# Patient Record
Sex: Female | Born: 1985
Health system: Southern US, Community
[De-identification: ages and names within clinical notes are randomized; demographics above are authoritative.]

## PROBLEM LIST (undated history)

## (undated) DIAGNOSIS — Z720 Tobacco use: Secondary | ICD-10-CM

## (undated) DIAGNOSIS — L409 Psoriasis, unspecified: Secondary | ICD-10-CM

## (undated) DIAGNOSIS — O020 Blighted ovum and nonhydatidiform mole: Secondary | ICD-10-CM

## (undated) DIAGNOSIS — K219 Gastro-esophageal reflux disease without esophagitis: Secondary | ICD-10-CM

## (undated) HISTORY — DX: Gastro-esophageal reflux disease without esophagitis: K21.9

## (undated) HISTORY — DX: Tobacco use: Z72.0

## (undated) HISTORY — DX: Psoriasis, unspecified: L40.9

## (undated) HISTORY — PX: DILATION AND CURETTAGE OF UTERUS: SHX78

## (undated) HISTORY — DX: Blighted ovum and nonhydatidiform mole: O02.0

---
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
## 2005-04-03 ENCOUNTER — Emergency Department (HOSPITAL_COMMUNITY): Admission: EM | Admit: 2005-04-03 | Discharge: 2005-04-03 | Payer: Self-pay | Admitting: Emergency Medicine

## 2005-09-21 ENCOUNTER — Inpatient Hospital Stay: Payer: Self-pay | Admitting: Obstetrics & Gynecology

## 2005-09-26 ENCOUNTER — Emergency Department: Payer: Self-pay | Admitting: Emergency Medicine

## 2006-12-24 ENCOUNTER — Emergency Department: Payer: Self-pay | Admitting: Emergency Medicine

## 2008-01-24 ENCOUNTER — Emergency Department: Payer: Self-pay | Admitting: Emergency Medicine

## 2008-11-30 ENCOUNTER — Emergency Department: Payer: Self-pay | Admitting: Internal Medicine

## 2009-03-15 ENCOUNTER — Emergency Department: Payer: Self-pay | Admitting: Emergency Medicine

## 2010-03-13 ENCOUNTER — Emergency Department: Payer: Self-pay | Admitting: Internal Medicine

## 2011-01-18 ENCOUNTER — Ambulatory Visit: Payer: Self-pay | Admitting: Family Medicine

## 2011-01-30 HISTORY — PX: TUBAL LIGATION: SHX77

## 2011-03-19 ENCOUNTER — Emergency Department: Payer: Self-pay | Admitting: Emergency Medicine

## 2011-03-19 LAB — URINALYSIS, COMPLETE
Bilirubin,UR: NEGATIVE
Glucose,UR: NEGATIVE mg/dL (ref 0–75)
Ketone: NEGATIVE
Leukocyte Esterase: NEGATIVE
Specific Gravity: 1.01 (ref 1.003–1.030)
Squamous Epithelial: 1

## 2011-03-19 LAB — CBC
HCT: 32.2 % — ABNORMAL LOW (ref 35.0–47.0)
HGB: 11.2 g/dL — ABNORMAL LOW (ref 12.0–16.0)
MCV: 87 fL (ref 80–100)
RBC: 3.72 10*6/uL — ABNORMAL LOW (ref 3.80–5.20)
RDW: 13.5 % (ref 11.5–14.5)
WBC: 9.1 10*3/uL (ref 3.6–11.0)

## 2011-03-19 LAB — HCG, QUANTITATIVE, PREGNANCY: Beta Hcg, Quant.: 53644 m[IU]/mL — ABNORMAL HIGH

## 2011-08-20 ENCOUNTER — Observation Stay: Payer: Self-pay

## 2011-08-24 ENCOUNTER — Observation Stay: Payer: Self-pay | Admitting: Obstetrics & Gynecology

## 2011-08-24 LAB — URINALYSIS, COMPLETE
Bacteria: NONE SEEN
Bilirubin,UR: NEGATIVE
Blood: NEGATIVE
Glucose,UR: NEGATIVE mg/dL (ref 0–75)
Protein: NEGATIVE
Specific Gravity: 1.008 (ref 1.003–1.030)
WBC UR: 1 /HPF (ref 0–5)

## 2011-09-07 ENCOUNTER — Inpatient Hospital Stay: Payer: Self-pay | Admitting: Obstetrics and Gynecology

## 2011-09-07 LAB — CBC WITH DIFFERENTIAL/PLATELET
Basophil %: 0.3 %
Eosinophil #: 0.1 10*3/uL (ref 0.0–0.7)
HCT: 31.8 % — ABNORMAL LOW (ref 35.0–47.0)
HGB: 10.9 g/dL — ABNORMAL LOW (ref 12.0–16.0)
Lymphocyte #: 1.9 10*3/uL (ref 1.0–3.6)
MCH: 29 pg (ref 26.0–34.0)
MCHC: 34.4 g/dL (ref 32.0–36.0)
MCV: 84 fL (ref 80–100)
Monocyte #: 1.2 x10 3/mm — ABNORMAL HIGH (ref 0.2–0.9)
Monocyte %: 11.3 %
Neutrophil #: 7.6 10*3/uL — ABNORMAL HIGH (ref 1.4–6.5)

## 2011-09-12 LAB — PATHOLOGY REPORT

## 2012-01-04 IMAGING — US US OB < 14 WEEKS - US OB TV
1 series · 17 of 28 positions shown · non-contrast
Comparison: none

REASON FOR EXAM: Hx of molar pregnancy
COMMENTS:

[Series 1: us ob < 14 weeks - us ob tv · 17 of 73 slices shown]
[im 1/73]
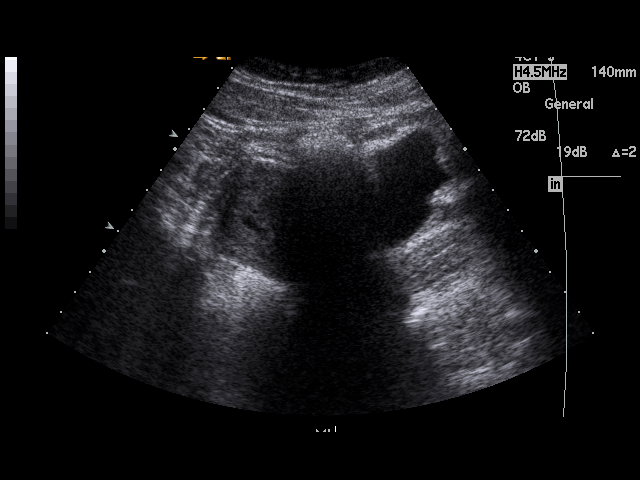
[im 6/73]
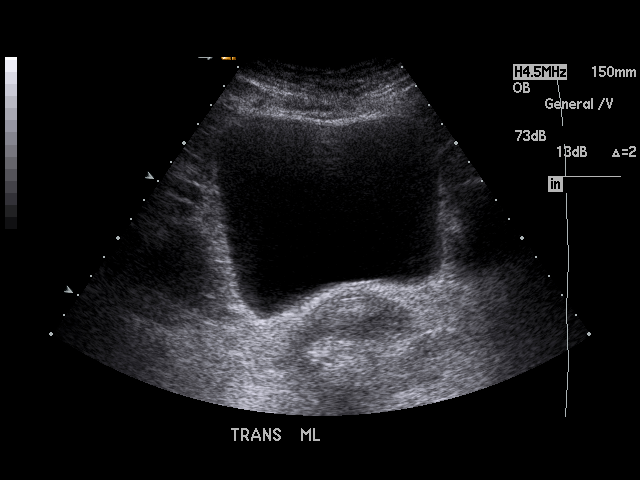
[im 11/73]
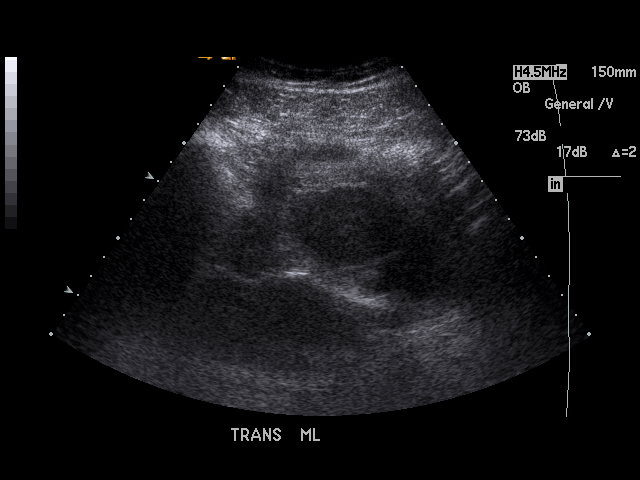
[im 14/73]
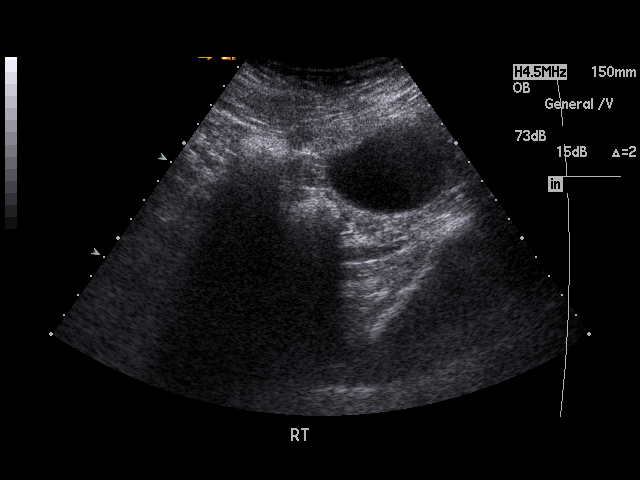
[im 19/73]
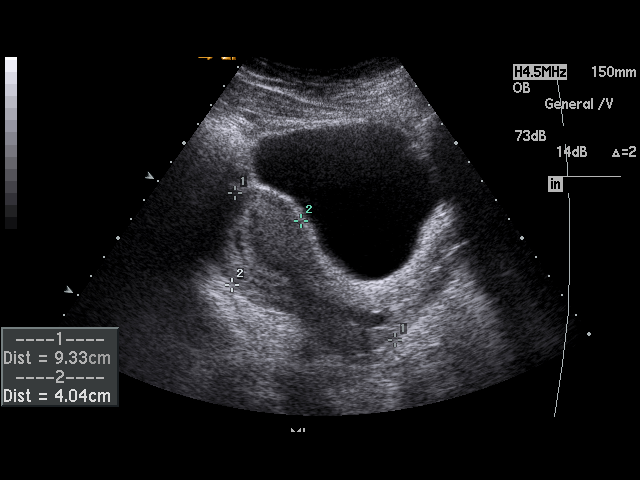
[im 25/73]
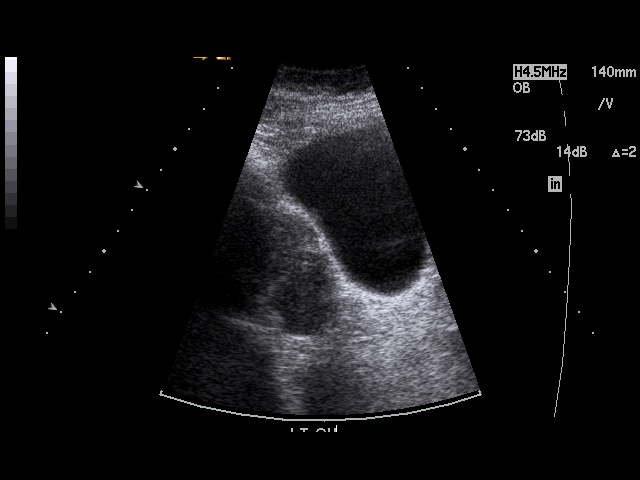
[im 27/73]
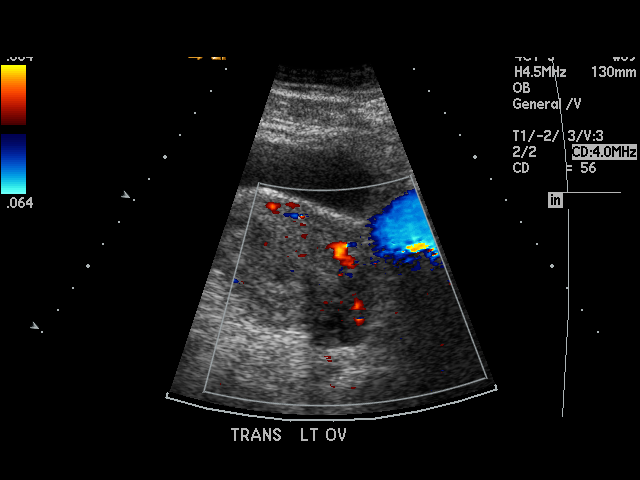
[im 33/73]
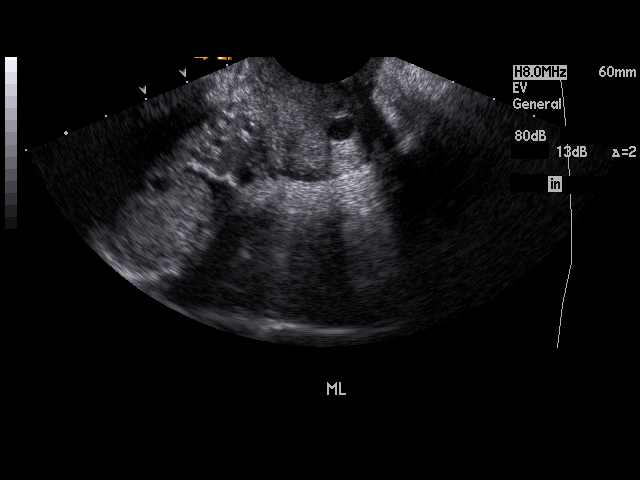
[im 38/73]
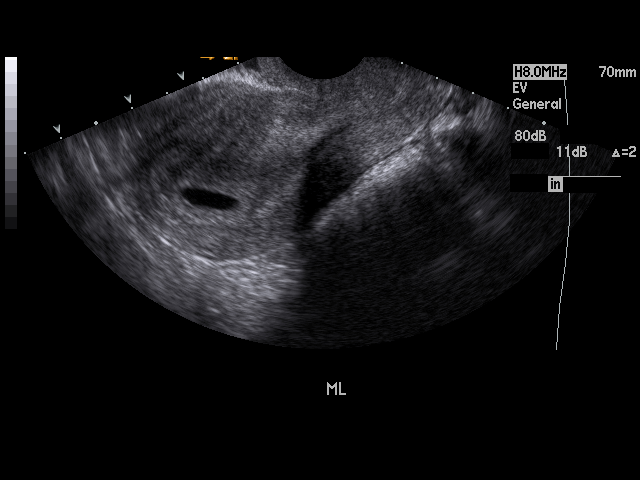
[im 41/73]
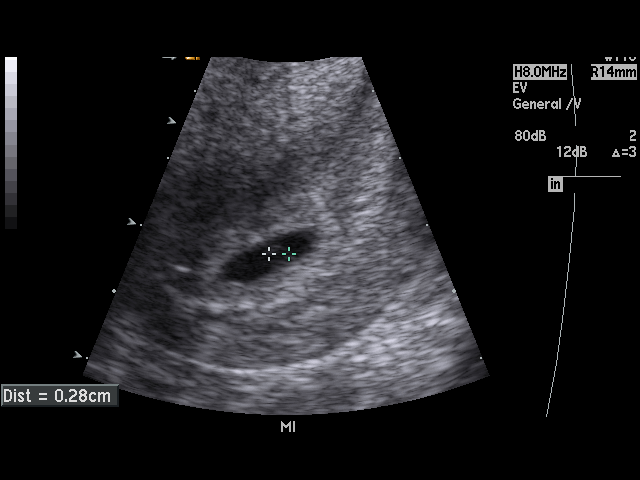
[im 46/73]
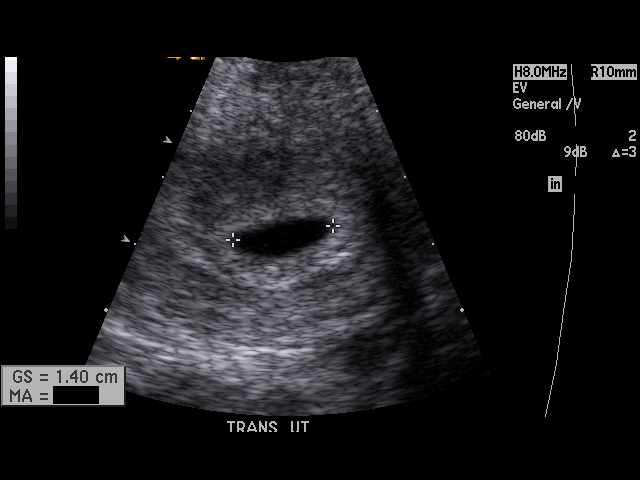
[im 49/73]
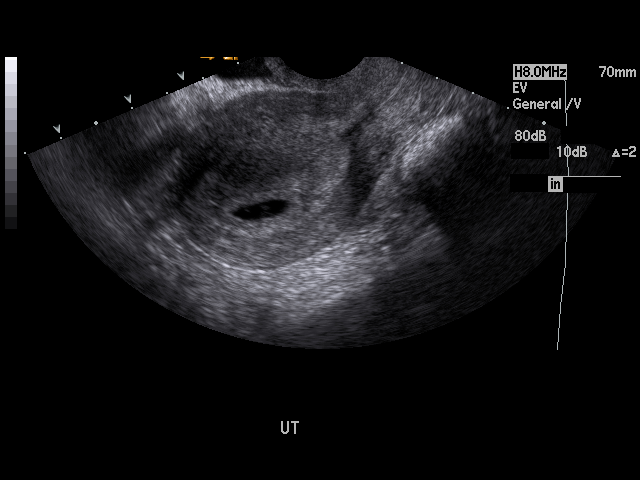
[im 54/73]
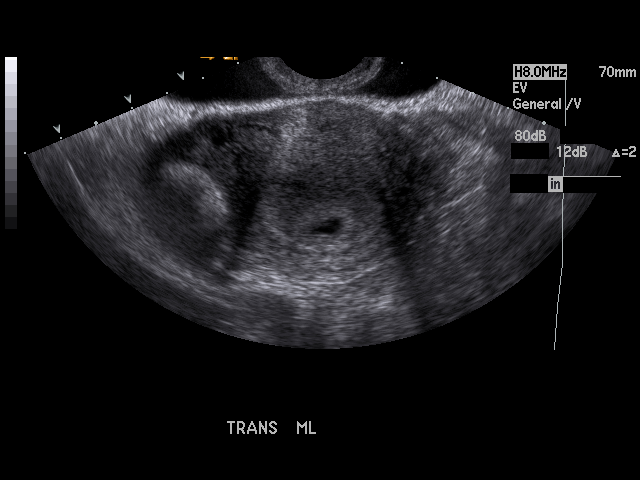
[im 59/73]
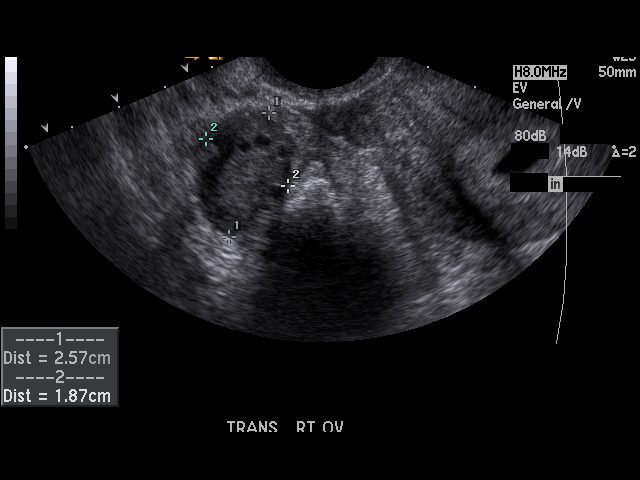
[im 62/73]
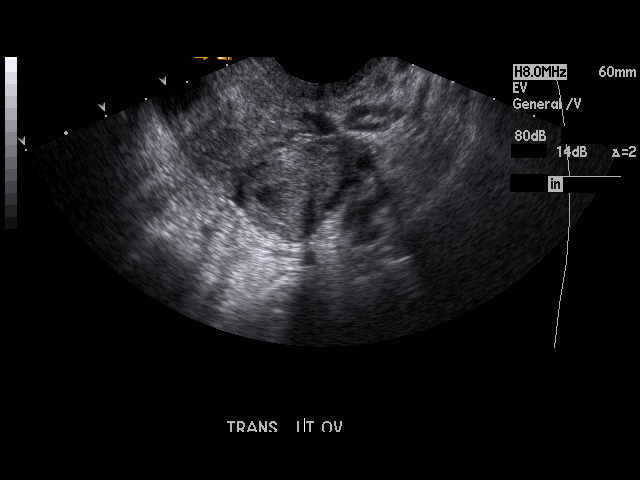
[im 67/73]
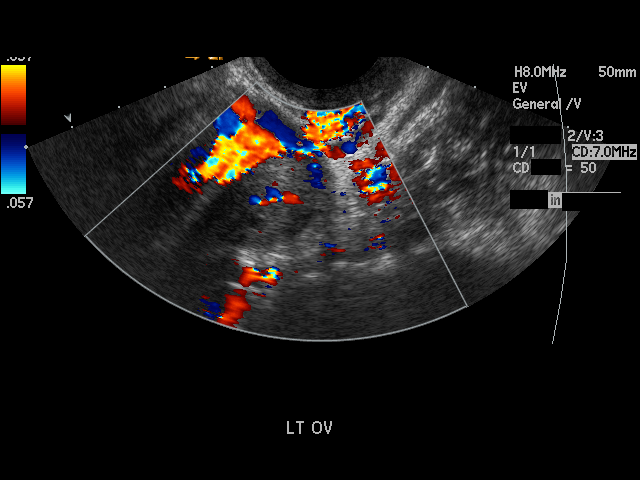
[im 73/73]
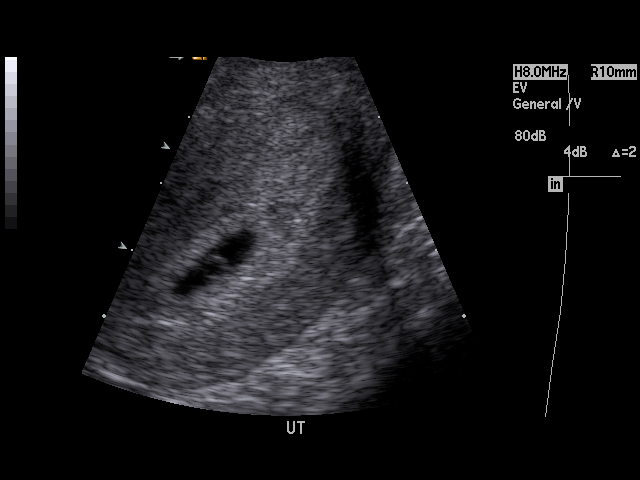

[17 of 28 positions shown; findings below may reference images not displayed]

PROCEDURE:     US  - US OB LESS THAN 14 WEEKS/W TRANS  - January 18, 2011 [DATE]

RESULT:     Transabdominal and endovaginal ultrasound was performed. There
is an intrauterine cystic structure that likely represents an intrauterine
gestational sac. Within the sac, there is a 2.8 mm concentric cystic area
compatible with a yolk sac. No intrauterine embryo is seen. Gestational sac
size corresponds to a menstrual age of approximately 5 weeks 2 days.

The absence of visualization of an intrauterine embryo in the pregnant
patient can be seen with very early intrauterine gestation where the embryo
is not yet visible, blighted ovum, recent abortion or ectopic pregnancy.
Since what appears to be a yolk sac is present, the most likely for this
patient is a very early intrauterine gestation where the embryo is not yet
visible. Confirmation by followup ultrasound is suggested. No abnormal
adnexal masses are seen. The right and left ovaries are visualized.
Incidental note is made of a 2.07 cm complex cyst or corpus luteal cyst of
the left ovary. Ovarian follicles are seen bilaterally. No free fluid is
observed in the pelvis.
IMPRESSION: 1. There is observed an intrauterine cystic area consistent with a
gestational sac of approximately 5 weeks 2 days.
2. What appears to be a yolk sac is visualized.
3. No intrauterine uterine embryo is seen at this time. Followup examination
is, therefore, recommended.

## 2012-03-04 IMAGING — US US OB US >=[ID] SNGL FETUS
1 series · 17 of 28 positions shown · non-contrast
Comparison: none

REASON FOR EXAM: 14  weeks pregnant - bleeding post coitial
COMMENTS:

PROCEDURE:     US  - US OB GREATER/OR EQUAL TO [id number]  - March 19, 2011  [DATE]
RESULT:

[Series 1: us ob us >=(id) sngl fetus · 17 of 52 slices shown]
[im 1/52]
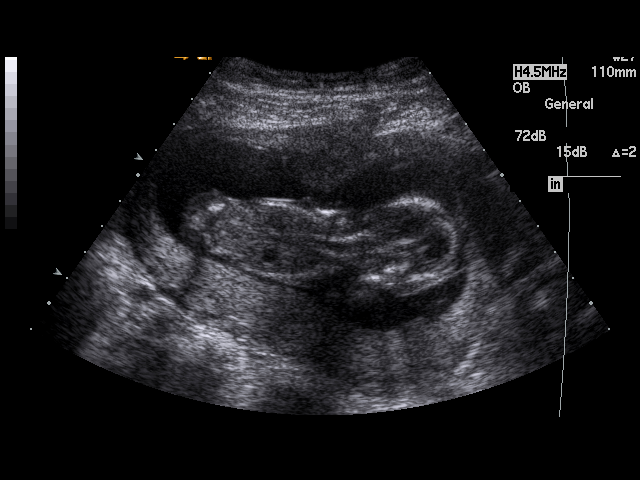
[im 4/52]
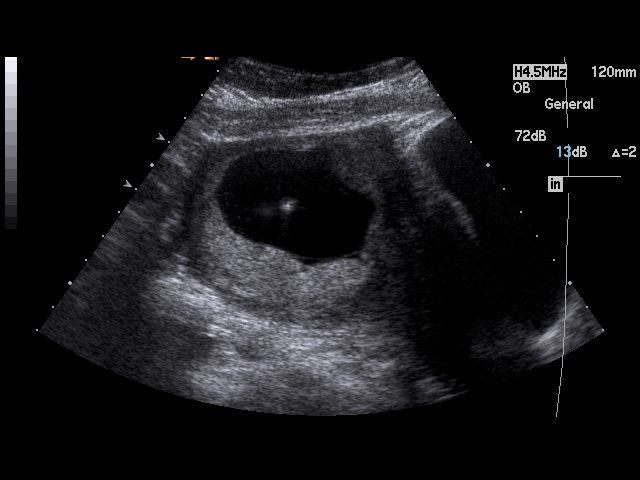
[im 8/52]
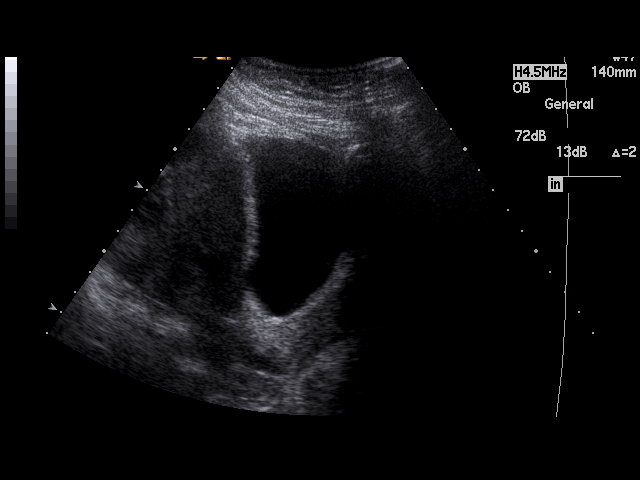
[im 10/52]
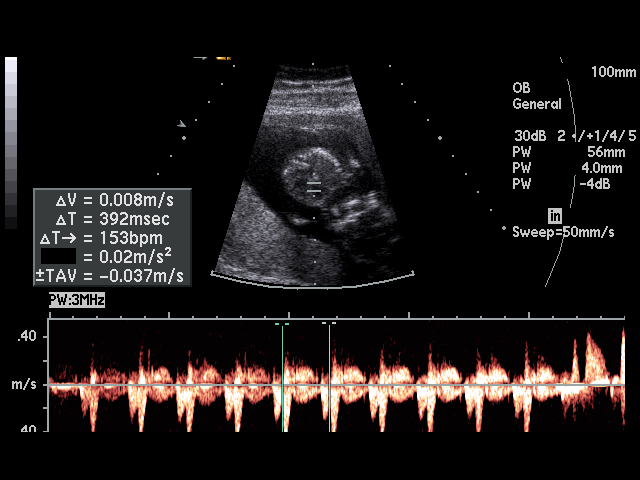
[im 14/52]
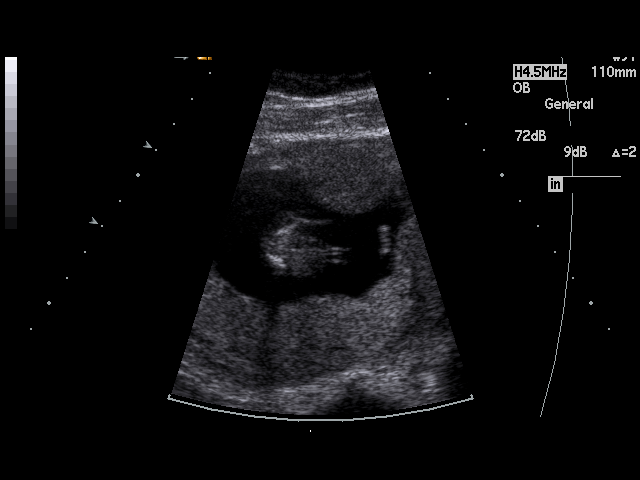
[im 18/52]
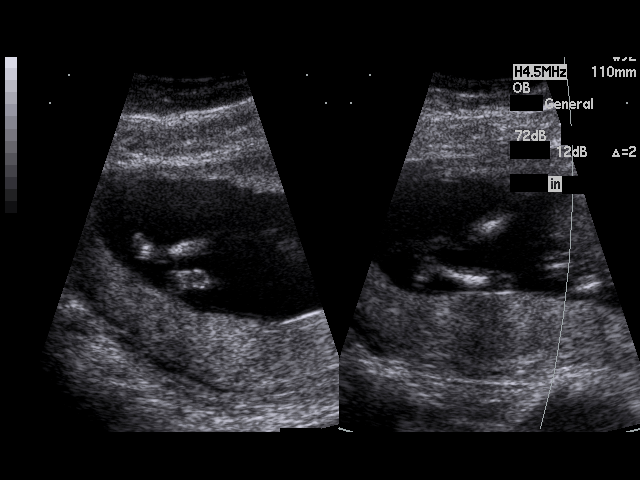
[im 19/52]
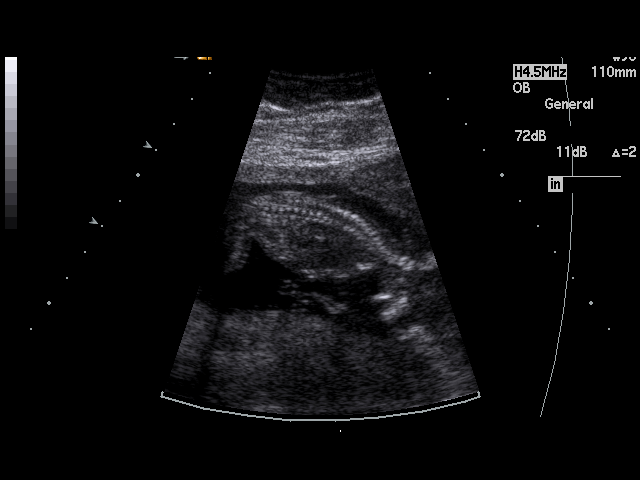
[im 23/52]
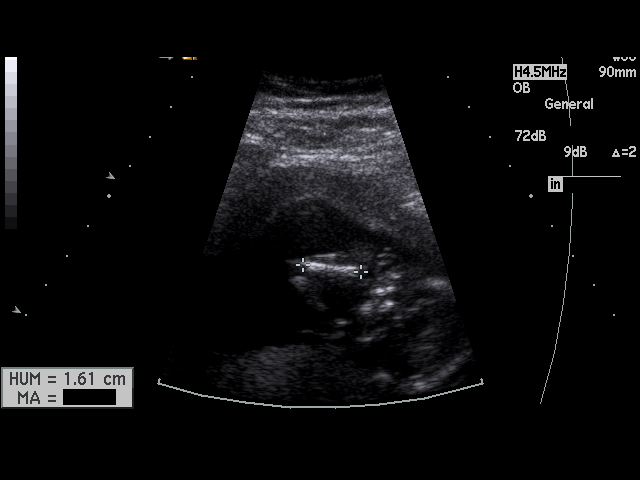
[im 27/52]
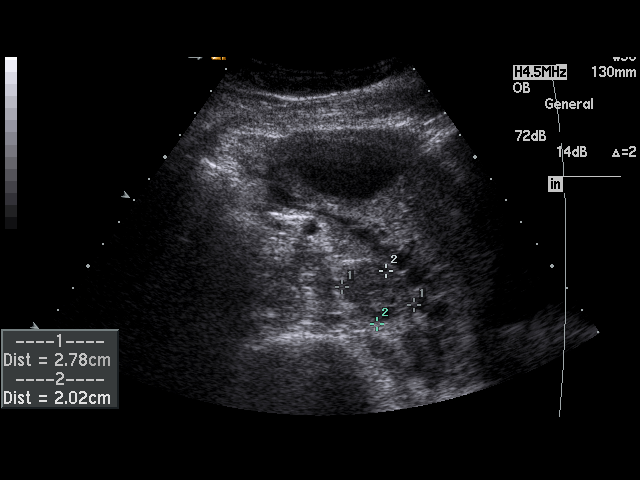
[im 29/52]
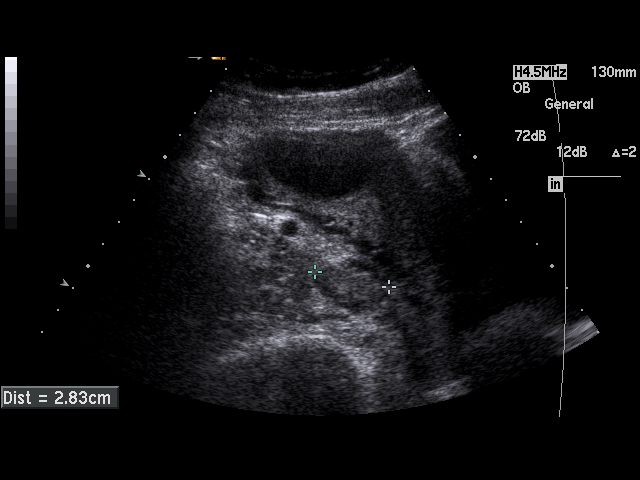
[im 33/52]
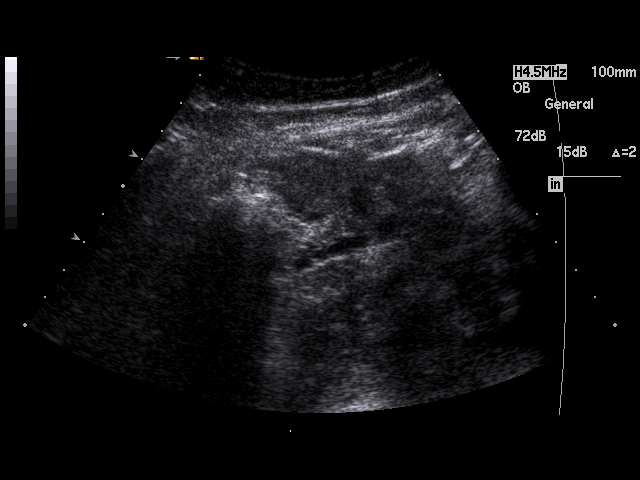
[im 35/52]
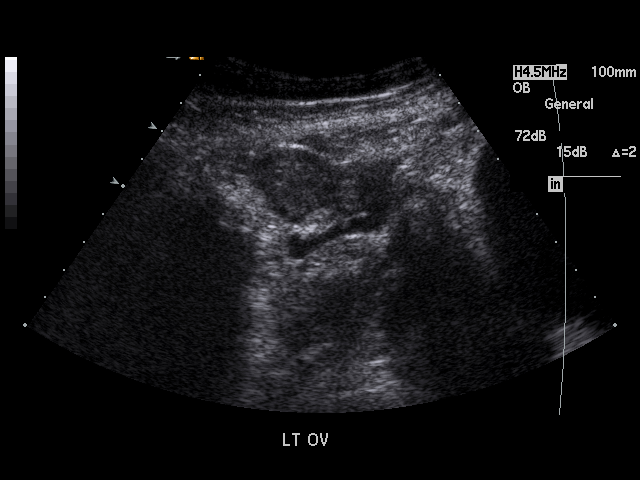
[im 38/52]
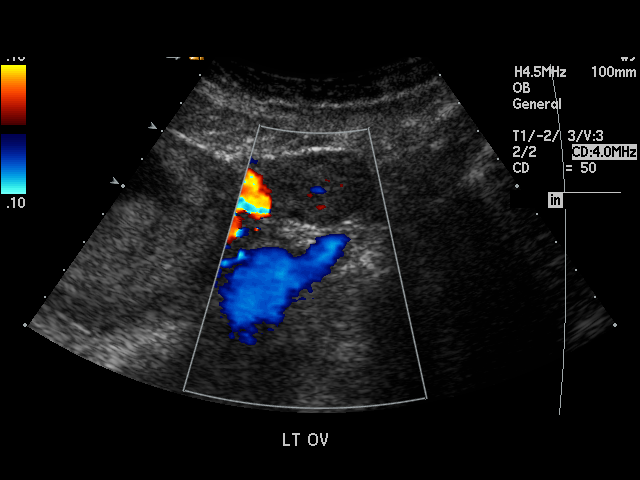
[im 42/52]
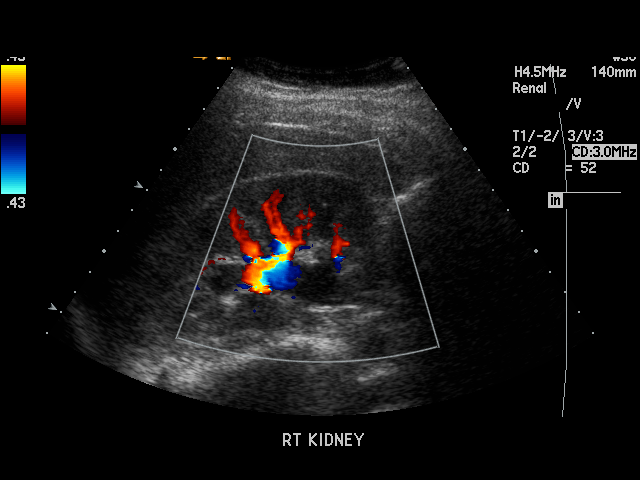
[im 44/52]
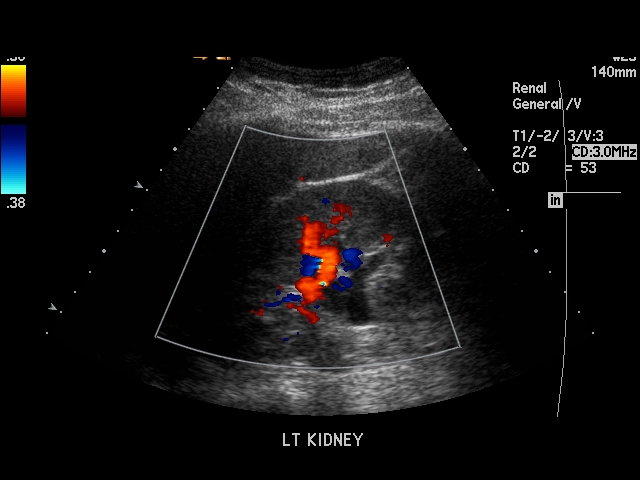
[im 48/52]
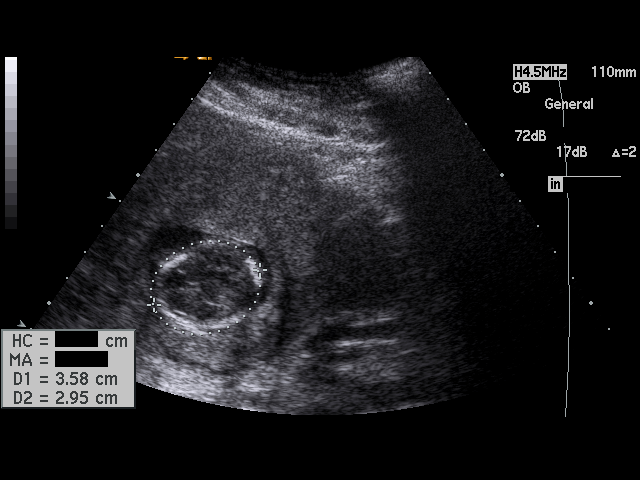
[im 52/52]
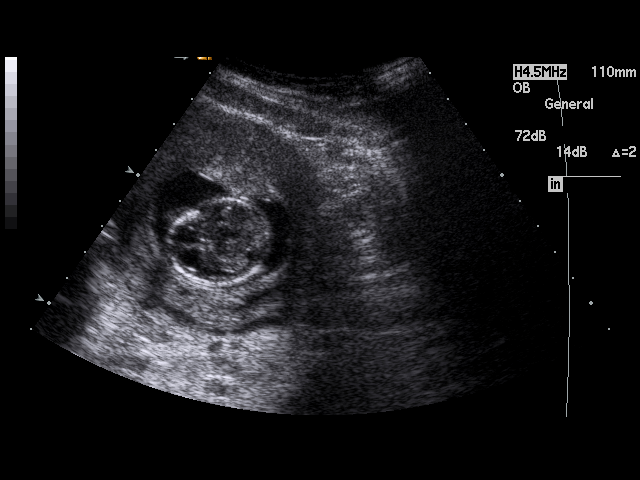

[17 of 28 positions shown; findings below may reference images not displayed]

FINDINGS: Single, viable intrauterine pregnancy is appreciated with
estimated fetal heart rate of 153 beats per minute. Amniotic fluid is
unremarkable. Limited evaluation of the fetal anatomy due to early
gestational age demonstrates no bladder or stomach abnormalities.

Fetal biometry:

BPD is 2.58 cm; EGA 14 weeks, 3 days

HC is 10.15 cm; EGA 14 weeks, 5 days

AC is 8.09 cm; EGA 14 weeks, 3 days

FL is 1.46 cm; EGA 14 weeks, 3 days

HUM is 1.58 cm; EGA 14 weeks, 3 days

There is no evidence of pelvic free fluid or masses. The ovaries are
unremarkable. The placenta is unremarkable and projects in a fundal
location. There is no evidence of hydronephrosis. There is mild pelviectasis
along the right kidney.

Estimated fetal weight is 117 grams + / - 16 grams which corresponds to 4
ounces + / - 1 ounce. Estimated gestational age is 14 weeks, 3 days per
ultrasound with an EDD of 09/14/2011.
IMPRESSION: 1.  Single, viable intrauterine pregnancy as described above.
2.  Dr. Leion of the Emergency Department was informed of these findings via
a preliminary faxed report.

## 2013-09-15 ENCOUNTER — Emergency Department: Payer: Self-pay | Admitting: Emergency Medicine

## 2013-09-15 LAB — URINALYSIS, COMPLETE
Bilirubin,UR: NEGATIVE
Glucose,UR: NEGATIVE mg/dL (ref 0–75)
Ketone: NEGATIVE
NITRITE: POSITIVE
Ph: 6 (ref 4.5–8.0)
Protein: 100
RBC,UR: 34 /HPF (ref 0–5)
Specific Gravity: 1.019 (ref 1.003–1.030)
Transitional Epi: 3
WBC UR: 399 /HPF (ref 0–5)

## 2013-09-17 LAB — URINE CULTURE

## 2014-04-02 LAB — HM PAP SMEAR: HM PAP: NORMAL

## 2014-05-18 NOTE — Op Note (Signed)
PATIENT NAME:  Beth Bass, Beth Bass MR#:  [id number] DATE OF BIRTH:  1985-12-18  DATE OF PROCEDURE:  09/09/2011  PREOPERATIVE DIAGNOSIS: Multiparous female desiring permanent sterilization.   POSTOPERATIVE DIAGNOSIS: Multiparous female desiring permanent sterilization.   PROCEDURE: Postpartum bilateral tubal ligation.   ESTIMATED BLOOD LOSS: 50 mL.   SURGEON: Adria Devonarrie Richelle Glick, Bass.D.   ANESTHESIA: General endotracheal anesthesia and local.   DESCRIPTION OF PROCEDURE: The patient was taken to the operating room and placed in supine position. After adequate general endotracheal anesthesia was instilled, the patient was prepped and draped in the usual sterile fashion. Time-out was performed. The umbilicus was then injected with Marcaine. An infraumbilical skin incision was made and carried sharply down to the fascia. The fascia was grasped with Allis clamps. The peritoneum was entered sharply with Metzenbaum scissors. The surgeon's fingers were used to spread the incision. S retractors were placed into the abdomen. The patient was placed in Trendelenburg. Mini laparotomy sponge was used to pack back the omentum. First attention was then turned to the left side where the tube was grasped and carried out to the fimbriated end. It was then carried back to the mid ampullary region where an incision was made in the broad ligament. Two pieces of plain gut suture were then used to tie off a portion of the tube.  The tube was cut out. Tubal telescoping was seen. Good hemostasis was seen. The tube was allowed to fall back into the pelvis. Attention was then turned to the right tube which was grasped with a Babcock, carried out to the fimbriated end, and carried back to the mid ampullary region. Incision was made with the Bovie in the broad ligament. Two pieces of plain gut suture were then placed through the incision and approximately a 2-cm piece of tube was tied off. The tube was then excised with the Metzenbaum  scissors. Good hemostasis was identified. Tubal telescoping was then seen. The tube was allowed to fall back into the abdomen. The mini laparotomy sponge was removed and hemostasis was checked bilaterally on both tubes. Good hemostasis was seen. The fascia was then grasped and closed with UR-6 Vicryl suture and one deep suture. Then 4-0 Monocryl was used to approximate the skin edges. Dermabond was placed on the skin. Tegaderm and a  2 x 2 was placed.  The patient was taken out of Trendelenburg and laid supine.  The patient was then taken to recovery after having tolerated the procedure well.       ____________________________ Elliot Gurneyarrie C. Clarise Chacko, MD cck:bjt D: 09/13/2011 17:29:52 ET T: 09/14/2011 09:59:44 ET JOB#: [id number]  cc: Elliot Gurneyarrie C. Enrrique Mierzwa, MD, <Dictator> Elliot GurneyARRIE C Muriel Wilber MD ELECTRONICALLY SIGNED 09/18/2011 9:37

## 2014-06-08 NOTE — H&P (Signed)
L&D Evaluation:  History Expanded:   HPI 29 yo G3P1011 w estimated date of confinement 09/14/11 with abd pain and bladder pain.  NO ROM or vaginal bleeding.  [facility].    Gravida 3    Term 1    PreTerm 0    Abortion 1    Living 1    Presents with abdominal pain    Patient's Medical History No Chronic Illness    Patient's Surgical History none    Medications Pre Natal Vitamins    Allergies NKDA    Social History none    Family History Non-Contributory   ROS:   ROS All systems were reviewed.  HEENT, CNS, GI, GU, Respiratory, CV, Renal and Musculoskeletal systems were found to be normal.   Exam:   Vital Signs stable    General no apparent distress    Mental Status clear    Chest clear    Heart normal sinus rhythm, no murmur/gallop/rubs    Abdomen gravid, non-tender    Estimated Fetal Weight Average for gestational age    Back no CVAT    Edema no edema    Pelvic no external lesions, 1/75    Mebranes Intact    FHT normal rate with no decels    Ucx irregular    Skin dry   Impression:   Impression Eval for labor, UTI, other.  Abd Pain.   Plan:   Plan UA, EFM/NST, monitor contractions and for cervical change, fluids   Electronic Signatures: Letitia LibraHarris, Arianny Pun Paul (MD)  (Signed 26-Jul-13 12:01)  Authored: L&D Evaluation   Last Updated: 26-Jul-13 12:01 by Letitia LibraHarris, Avik Leoni Paul (MD)

## 2016-05-04 DIAGNOSIS — L308 Other specified dermatitis: Secondary | ICD-10-CM | POA: Diagnosis not present

## 2016-05-09 DIAGNOSIS — I10 Essential (primary) hypertension: Secondary | ICD-10-CM | POA: Diagnosis not present

## 2016-05-09 DIAGNOSIS — E784 Other hyperlipidemia: Secondary | ICD-10-CM | POA: Diagnosis not present

## 2016-05-09 DIAGNOSIS — R5381 Other malaise: Secondary | ICD-10-CM | POA: Diagnosis not present

## 2016-05-11 DIAGNOSIS — R109 Unspecified abdominal pain: Secondary | ICD-10-CM | POA: Diagnosis not present

## 2016-05-11 DIAGNOSIS — E639 Nutritional deficiency, unspecified: Secondary | ICD-10-CM | POA: Diagnosis not present

## 2016-05-11 DIAGNOSIS — E889 Metabolic disorder, unspecified: Secondary | ICD-10-CM | POA: Diagnosis not present

## 2016-05-11 DIAGNOSIS — K2950 Unspecified chronic gastritis without bleeding: Secondary | ICD-10-CM | POA: Diagnosis not present

## 2016-08-17 DIAGNOSIS — L400 Psoriasis vulgaris: Secondary | ICD-10-CM | POA: Diagnosis not present

## 2016-10-05 ENCOUNTER — Ambulatory Visit: Payer: Self-pay | Admitting: Advanced Practice Midwife

## 2017-03-22 ENCOUNTER — Ambulatory Visit (INDEPENDENT_AMBULATORY_CARE_PROVIDER_SITE_OTHER): Payer: 59 | Admitting: Advanced Practice Midwife

## 2017-03-22 ENCOUNTER — Encounter: Payer: Self-pay | Admitting: Advanced Practice Midwife

## 2017-03-22 VITALS — BP 118/74 | Ht 60.0 in | Wt 153.0 lb

## 2017-03-22 DIAGNOSIS — Z124 Encounter for screening for malignant neoplasm of cervix: Secondary | ICD-10-CM | POA: Diagnosis not present

## 2017-03-22 DIAGNOSIS — Z01419 Encounter for gynecological examination (general) (routine) without abnormal findings: Secondary | ICD-10-CM

## 2017-03-22 NOTE — Patient Instructions (Signed)
Health Maintenance, Female Adopting a healthy lifestyle and getting preventive care can go a long way to promote health and wellness. Talk with your health care provider about what schedule of regular examinations is right for you. This is a good chance for you to check in with your provider about disease prevention and staying healthy. In between checkups, there are plenty of things you can do on your own. Experts have done a lot of research about which lifestyle changes and preventive measures are most likely to keep you healthy. Ask your health care provider for more information. Weight and diet Eat a healthy diet  Be sure to include plenty of vegetables, fruits, low-fat dairy products, and lean protein.  Do not eat a lot of foods high in solid fats, added sugars, or salt.  Get regular exercise. This is one of the most important things you can do for your health. ? Most adults should exercise for at least 150 minutes each week. The exercise should increase your heart rate and make you sweat (moderate-intensity exercise). ? Most adults should also do strengthening exercises at least twice a week. This is in addition to the moderate-intensity exercise.  Maintain a healthy weight  Body mass index (BMI) is a measurement that can be used to identify possible weight problems. It estimates body fat based on height and weight. Your health care provider can help determine your BMI and help you achieve or maintain a healthy weight.  For females 69 years of age and older: ? A BMI below 18.5 is considered underweight. ? A BMI of 18.5 to 24.9 is normal. ? A BMI of 25 to 29.9 is considered overweight. ? A BMI of 30 and above is considered obese.  Watch levels of cholesterol and blood lipids  You should start having your blood tested for lipids and cholesterol at 32 years of age, then have this test every 5 years.  You may need to have your cholesterol levels checked more often if: ? Your lipid or  cholesterol levels are high. ? You are older than 32 years of age. ? You are at high risk for heart disease.  Cancer screening Lung Cancer  Lung cancer screening is recommended for adults 70-27 years old who are at high risk for lung cancer because of a history of smoking.  A yearly low-dose CT scan of the lungs is recommended for people who: ? Currently smoke. ? Have quit within the past 15 years. ? Have at least a 30-pack-year history of smoking. A pack year is smoking an average of one pack of cigarettes a day for 1 year.  Yearly screening should continue until it has been 15 years since you quit.  Yearly screening should stop if you develop a health problem that would prevent you from having lung cancer treatment.  Breast Cancer  Practice breast self-awareness. This means understanding how your breasts normally appear and feel.  It also means doing regular breast self-exams. Let your health care provider know about any changes, no matter how small.  If you are in your 20s or 30s, you should have a clinical breast exam (CBE) by a health care provider every 1-3 years as part of a regular health exam.  If you are 68 or older, have a CBE every year. Also consider having a breast X-ray (mammogram) every year.  If you have a family history of breast cancer, talk to your health care provider about genetic screening.  If you are at high risk  for breast cancer, talk to your health care provider about having an MRI and a mammogram every year.  Breast cancer gene (BRCA) assessment is recommended for women who have family members with BRCA-related cancers. BRCA-related cancers include: ? Breast. ? Ovarian. ? Tubal. ? Peritoneal cancers.  Results of the assessment will determine the need for genetic counseling and BRCA1 and BRCA2 testing.  Cervical Cancer Your health care provider may recommend that you be screened regularly for cancer of the pelvic organs (ovaries, uterus, and  vagina). This screening involves a pelvic examination, including checking for microscopic changes to the surface of your cervix (Pap test). You may be encouraged to have this screening done every 3 years, beginning at age 22.  For women ages 56-65, health care providers may recommend pelvic exams and Pap testing every 3 years, or they may recommend the Pap and pelvic exam, combined with testing for human papilloma virus (HPV), every 5 years. Some types of HPV increase your risk of cervical cancer. Testing for HPV may also be done on women of any age with unclear Pap test results.  Other health care providers may not recommend any screening for nonpregnant women who are considered low risk for pelvic cancer and who do not have symptoms. Ask your health care provider if a screening pelvic exam is right for you.  If you have had past treatment for cervical cancer or a condition that could lead to cancer, you need Pap tests and screening for cancer for at least 20 years after your treatment. If Pap tests have been discontinued, your risk factors (such as having a new sexual partner) need to be reassessed to determine if screening should resume. Some women have medical problems that increase the chance of getting cervical cancer. In these cases, your health care provider may recommend more frequent screening and Pap tests.  Colorectal Cancer  This type of cancer can be detected and often prevented.  Routine colorectal cancer screening usually begins at 32 years of age and continues through 32 years of age.  Your health care provider may recommend screening at an earlier age if you have risk factors for colon cancer.  Your health care provider may also recommend using home test kits to check for hidden blood in the stool.  A small camera at the end of a tube can be used to examine your colon directly (sigmoidoscopy or colonoscopy). This is done to check for the earliest forms of colorectal  cancer.  Routine screening usually begins at age 33.  Direct examination of the colon should be repeated every 5-10 years through 32 years of age. However, you may need to be screened more often if early forms of precancerous polyps or small growths are found.  Skin Cancer  Check your skin from head to toe regularly.  Tell your health care provider about any new moles or changes in moles, especially if there is a change in a mole's shape or color.  Also tell your health care provider if you have a mole that is larger than the size of a pencil eraser.  Always use sunscreen. Apply sunscreen liberally and repeatedly throughout the day.  Protect yourself by wearing long sleeves, pants, a wide-brimmed hat, and sunglasses whenever you are outside.  Heart disease, diabetes, and high blood pressure  High blood pressure causes heart disease and increases the risk of stroke. High blood pressure is more likely to develop in: ? People who have blood pressure in the high end of  the normal range (130-139/85-89 mm Hg). ? People who are overweight or obese. ? People who are African American.  If you are 21-29 years of age, have your blood pressure checked every 3-5 years. If you are 3 years of age or older, have your blood pressure checked every year. You should have your blood pressure measured twice-once when you are at a hospital or clinic, and once when you are not at a hospital or clinic. Record the average of the two measurements. To check your blood pressure when you are not at a hospital or clinic, you can use: ? An automated blood pressure machine at a pharmacy. ? A home blood pressure monitor.  If you are between 17 years and 37 years old, ask your health care provider if you should take aspirin to prevent strokes.  Have regular diabetes screenings. This involves taking a blood sample to check your fasting blood sugar level. ? If you are at a normal weight and have a low risk for diabetes,  have this test once every three years after 32 years of age. ? If you are overweight and have a high risk for diabetes, consider being tested at a younger age or more often. Preventing infection Hepatitis B  If you have a higher risk for hepatitis B, you should be screened for this virus. You are considered at high risk for hepatitis B if: ? You were born in a country where hepatitis B is common. Ask your health care provider which countries are considered high risk. ? Your parents were born in a high-risk country, and you have not been immunized against hepatitis B (hepatitis B vaccine). ? You have HIV or AIDS. ? You use needles to inject street drugs. ? You live with someone who has hepatitis B. ? You have had sex with someone who has hepatitis B. ? You get hemodialysis treatment. ? You take certain medicines for conditions, including cancer, organ transplantation, and autoimmune conditions.  Hepatitis C  Blood testing is recommended for: ? Everyone born from [birth year] through 1965. ? Anyone with known risk factors for hepatitis C.  Sexually transmitted infections (STIs)  You should be screened for sexually transmitted infections (STIs) including gonorrhea and chlamydia if: ? You are sexually active and are younger than 32 years of age. ? You are older than 32 years of age and your health care provider tells you that you are at risk for this type of infection. ? Your sexual activity has changed since you were last screened and you are at an increased risk for chlamydia or gonorrhea. Ask your health care provider if you are at risk.  If you do not have HIV, but are at risk, it may be recommended that you take a prescription medicine daily to prevent HIV infection. This is called pre-exposure prophylaxis (PrEP). You are considered at risk if: ? You are sexually active and do not regularly use condoms or know the HIV status of your partner(s). ? You take drugs by injection. ? You are  sexually active with a partner who has HIV.  Talk with your health care provider about whether you are at high risk of being infected with HIV. If you choose to begin PrEP, you should first be tested for HIV. You should then be tested every 3 months for as long as you are taking PrEP. Pregnancy  If you are premenopausal and you may become pregnant, ask your health care provider about preconception counseling.  If you may become  pregnant, take 400 to 800 micrograms (mcg) of folic acid every day.  If you want to prevent pregnancy, talk to your health care provider about birth control (contraception). Osteoporosis and menopause  Osteoporosis is a disease in which the bones lose minerals and strength with aging. This can result in serious bone fractures. Your risk for osteoporosis can be identified using a bone density scan.  If you are 52 years of age or older, or if you are at risk for osteoporosis and fractures, ask your health care provider if you should be screened.  Ask your health care provider whether you should take a calcium or vitamin D supplement to lower your risk for osteoporosis.  Menopause may have certain physical symptoms and risks.  Hormone replacement therapy may reduce some of these symptoms and risks. Talk to your health care provider about whether hormone replacement therapy is right for you. Follow these instructions at home:  Schedule regular health, dental, and eye exams.  Stay current with your immunizations.  Do not use any tobacco products including cigarettes, chewing tobacco, or electronic cigarettes.  If you are pregnant, do not drink alcohol.  If you are breastfeeding, limit how much and how often you drink alcohol.  Limit alcohol intake to no more than 1 drink per day for nonpregnant women. One drink equals 12 ounces of beer, 5 ounces of wine, or 1 ounces of hard liquor.  Do not use street drugs.  Do not share needles.  Ask your health care  provider for help if you need support or information about quitting drugs.  Tell your health care provider if you often feel depressed.  Tell your health care provider if you have ever been abused or do not feel safe at home. This information is not intended to replace advice given to you by your health care provider. Make sure you discuss any questions you have with your health care provider. Document Released: 07/31/2010 Document Revised: 06/23/2015 Document Reviewed: 10/19/2014 Elsevier Interactive Patient Education  2018 [employer].     Why follow it? Research shows. . Those who follow the Mediterranean diet have a reduced risk of heart disease  . The diet is associated with a reduced incidence of Parkinson's and Alzheimer's diseases . People following the diet may have longer life expectancies and lower rates of chronic diseases  . The Dietary Guidelines for Americans recommends the Mediterranean diet as an eating plan to promote health and prevent disease  What Is the Mediterranean Diet?  . Healthy eating plan based on typical foods and recipes of Mediterranean-style cooking . The diet is primarily a plant based diet; these foods should make up a majority of meals   Starches - Plant based foods should make up a majority of meals - They are an important sources of vitamins, minerals, energy, antioxidants, and fiber - Choose whole grains, foods high in fiber and minimally processed items  - Typical grain sources include wheat, oats, barley, corn, brown rice, bulgar, farro, millet, polenta, couscous  - Various types of beans include chickpeas, lentils, fava beans, black beans, white beans   Fruits  Veggies - Large quantities of antioxidant rich fruits & veggies; 6 or more servings  - Vegetables can be eaten raw or lightly drizzled with oil and cooked  - Vegetables common to the traditional Mediterranean Diet include: artichokes, arugula, beets, broccoli, brussel sprouts, cabbage,  carrots, celery, collard greens, cucumbers, eggplant, kale, leeks, lemons, lettuce, mushrooms, okra, onions, peas, peppers, potatoes, pumpkin, radishes, rutabaga,  shallots, spinach, sweet potatoes, turnips, zucchini - Fruits common to the Mediterranean Diet include: apples, apricots, avocados, cherries, clementines, dates, figs, grapefruits, grapes, melons, nectarines, oranges, peaches, pears, pomegranates, strawberries, tangerines  Fats - Replace butter and margarine with healthy oils, such as olive oil, canola oil, and tahini  - Limit nuts to no more than a handful a day  - Nuts include walnuts, almonds, pecans, pistachios, pine nuts  - Limit or avoid candied, honey roasted or heavily salted nuts - Olives are central to the [employer] - can be eaten whole or used in a variety of dishes   Meats Protein - Limiting red meat: no more than a few times a month - When eating red meat: choose lean cuts and keep the portion to the size of deck of cards - Eggs: approx. 0 to 4 times a week  - Fish and lean poultry: at least 2 a week  - Healthy protein sources include, chicken, [place], lean beef, lamb - Increase intake of seafood such as tuna, salmon, trout, mackerel, shrimp, scallops - Avoid or limit high fat processed meats such as sausage and bacon  Dairy - Include moderate amounts of low fat dairy products  - Focus on healthy dairy such as fat free yogurt, skim milk, low or reduced fat cheese - Limit dairy products higher in fat such as whole or 2% milk, cheese, ice cream  Alcohol - Moderate amounts of red wine is ok  - No more than 5 oz daily for women (all ages) and men older than age 18  - No more than 10 oz of wine daily for men younger than 98  Other - Limit sweets and other desserts  - Use herbs and spices instead of salt to flavor foods  - Herbs and spices common to the traditional Mediterranean Diet include: basil, bay leaves, chives, cloves, cumin, fennel, garlic, lavender, marjoram,  mint, oregano, parsley, pepper, rosemary, sage, savory, sumac, tarragon, thyme   It's not just a diet, it's a lifestyle:  . The Mediterranean diet includes lifestyle factors typical of those in the region  . Foods, drinks and meals are best eaten with others and savored . Daily physical activity is important for overall good health . This could be strenuous exercise like running and aerobics . This could also be more leisurely activities such as walking, housework, yard-work, or taking the stairs . Moderation is the key; a balanced and healthy diet accommodates most foods and drinks . Consider portion sizes and frequency of consumption of certain foods   Meal Ideas & Options:  . Breakfast:  o Whole wheat toast or whole wheat English muffins with peanut butter & hard boiled egg o Steel cut oats topped with apples & cinnamon and skim milk  o Fresh fruit: banana, strawberries, melon, berries, peaches  o Smoothies: strawberries, bananas, greek yogurt, peanut butter o Low fat greek yogurt with blueberries and granola  o Egg white omelet with spinach and mushrooms o Breakfast couscous: whole wheat couscous, apricots, skim milk, cranberries  . Sandwiches:  o Hummus and grilled vegetables (peppers, zucchini, squash) on whole wheat bread   o Grilled chicken on whole wheat pita with lettuce, tomatoes, cucumbers or tzatziki  o Tuna salad on whole wheat bread: tuna salad made with greek yogurt, olives, red peppers, capers, green onions o Garlic rosemary lamb pita: lamb sauted with garlic, rosemary, salt & pepper; add lettuce, cucumber, greek yogurt to pita - flavor with lemon juice and black pepper  .  Seafood:  o Mediterranean grilled salmon, seasoned with garlic, basil, parsley, lemon juice and black pepper o Shrimp, lemon, and spinach whole-grain pasta salad made with low fat greek yogurt  o Seared scallops with lemon orzo  o Seared tuna steaks seasoned salt, pepper, coriander topped with tomato  mixture of olives, tomatoes, olive oil, minced garlic, parsley, green onions and cappers  . Meats:  o Herbed greek chicken salad with kalamata olives, cucumber, feta  o Red bell peppers stuffed with spinach, bulgur, lean ground beef (or lentils) & topped with feta   o Kebabs: skewers of chicken, tomatoes, onions, zucchini, squash  o Turkey burgers: made with red onions, mint, dill, lemon juice, feta cheese topped with roasted red peppers . Vegetarian o Cucumber salad: cucumbers, artichoke hearts, celery, red onion, feta cheese, tossed in olive oil & lemon juice  o Hummus and whole grain pita points with a greek salad (lettuce, tomato, feta, olives, cucumbers, red onion) o Lentil soup with celery, carrots made with vegetable broth, garlic, salt and pepper  o Tabouli salad: parsley, bulgur, mint, scallions, cucumbers, tomato, radishes, lemon juice, olive oil, salt and pepper.      American Heart Association (AHA) Exercise Recommendation  Being physically active is important to prevent heart disease and stroke, the nation's No. 1and No. 5killers. To improve overall cardiovascular health, we suggest at least 150 minutes per week of moderate exercise or 75 minutes per week of vigorous exercise (or a combination of moderate and vigorous activity). Thirty minutes a day, five times a week is an easy goal to remember. You will also experience benefits even if you divide your time into two or three segments of 10 to 15 minutes per day.  For people who would benefit from lowering their blood pressure or cholesterol, we recommend 40 minutes of aerobic exercise of moderate to vigorous intensity three to four times a week to lower the risk for heart attack and stroke.  Physical activity is anything that makes you move your body and burn calories.  This includes things like climbing stairs or playing sports. Aerobic exercises benefit your heart, and include walking, jogging, swimming or biking. Strength and  stretching exercises are best for overall stamina and flexibility.  The simplest, positive change you can make to effectively improve your heart health is to start walking. It's enjoyable, free, easy, social and great exercise. A walking program is flexible and boasts high success rates because people can stick with it. It's easy for walking to become a regular and satisfying part of life.   For Overall Cardiovascular Health:  At least 30 minutes of moderate-intensity aerobic activity at least 5 days per week for a total of 150  OR   At least 25 minutes of vigorous aerobic activity at least 3 days per week for a total of 75 minutes; or a combination of moderate- and vigorous-intensity aerobic activity  AND   Moderate- to high-intensity muscle-strengthening activity at least 2 days per week for additional health benefits.  For Lowering Blood Pressure and Cholesterol  An average 40 minutes of moderate- to vigorous-intensity aerobic activity 3 or 4 times per week  What if I can't make it to the time goal? Something is always better than nothing! And everyone has to start somewhere. Even if you've been sedentary for years, today is the day you can begin to make healthy changes in your life. If you don't think you'll make it for 30 or 40 minutes, set a reachable goal for   today. You can work up toward your overall goal by increasing your time as you get stronger. Don't let all-or-nothing thinking rob you of doing what you can every day.  Source:http://www.heart.org    

## 2017-03-22 NOTE — Progress Notes (Signed)
Patient ID: Beth Bass, female   DOB: 08/12/1985, 32 y.o.   MRN: [id number]     Gynecology Annual Exam   PCP: Corky DownsMasoud, Javed, MD  Chief Complaint:  Chief Complaint  Patient presents with  . Annual Exam    History of Present Illness: Patient is a 32 y.o. G3P2002 presents for annual exam. The patient has no complaints today.   LMP: Patient's last menstrual period was 03/01/2017. Average Interval: regular, 28 days Duration of flow: 3 days Heavy Menses: first 2 days Clots: no Intermenstrual Bleeding: no Postcoital Bleeding: no Dysmenorrhea: no  The patient is sexually active. She currently uses tubal ligation for contraception. She denies dyspareunia.  The patient does occasionally perform self breast exams.  There is no notable family history of breast or ovarian cancer in her family.  The patient wears seatbelts: yes.   The patient has regular exercise: yes.  She admits eating a healthy diet and adequate hydration.   The patient denies current symptoms of depression.  Patient admits 1 sad day per month at the time of her cycle and is able to cope well generally. She has a 15 year cigarette use history. She is currently smoking 5 cigarettes per day. She admits to being ready to quit and is trying to find the right strategy.   Review of Systems: Review of Systems  Constitutional: Negative.   HENT: Negative.   Eyes: Negative.   Respiratory: Negative.   Cardiovascular: Negative.   Gastrointestinal: Negative.   Genitourinary: Negative.   Musculoskeletal: Negative.   Skin: Negative.   Neurological: Negative.   Endo/Heme/Allergies: Negative.   Psychiatric/Behavioral: Negative.     Past Medical History:  Past Medical History:  Diagnosis Date  . Amenorrhea     Past Surgical History:  Past Surgical History:  Procedure Laterality Date  . DILATION AND CURETTAGE OF UTERUS    . TUBAL LIGATION      Gynecologic History:  Patient's last menstrual period was  03/01/2017. Contraception: tubal ligation Last Pap: Results were: no abnormalities   Obstetric History: [id number]  Family History:  History reviewed. No pertinent family history.  Social History:  Social History   Socioeconomic History  . Marital status: Single    Spouse name: Not on file  . Number of children: Not on file  . Years of education: Not on file  . Highest education level: Not on file  Social Needs  . Financial resource strain: Not on file  . Food insecurity - worry: Not on file  . Food insecurity - inability: Not on file  . Transportation needs - medical: Not on file  . Transportation needs - non-medical: Not on file  Occupational History  . Not on file  Tobacco Use  . Smoking status: Current Every Day Smoker  . Smokeless tobacco: Never Used  Substance and Sexual Activity  . Alcohol use: Yes  . Drug use: No  . Sexual activity: Yes    Birth control/protection: Surgical  Other Topics Concern  . Not on file  Social History Narrative  . Not on file    Allergies:  Allergies  Allergen Reactions  . Penicillins Hives    Medications: Prior to Admission medications   Medication Sig Start Date End Date Taking? Authorizing Provider  omeprazole (PRILOSEC) 40 MG capsule  03/18/17   [provider]    Physical Exam Vitals: Blood pressure 118/74, height 5' (1.524 m), weight 153 lb (69.4 kg), last menstrual period 03/01/2017.  General: NAD HEENT: normocephalic, anicteric  Thyroid: no enlargement, no palpable nodules Pulmonary: No increased work of breathing, CTAB Cardiovascular: RRR, distal pulses 2+ Breast: Breast symmetrical, no tenderness, no palpable nodules or masses, no skin or nipple retraction present, no nipple discharge.  No axillary or supraclavicular lymphadenopathy. Abdomen: NABS, soft, non-tender, non-distended.  Umbilicus without lesions.  No hepatomegaly, splenomegaly or masses palpable. No evidence of hernia  Genitourinary:  External:  Normal external female genitalia.  Normal urethral meatus, normal  Bartholin's and Skene's glands.    Vagina: Normal vaginal mucosa, no evidence of prolapse.    Cervix: Grossly normal in appearance, no bleeding, no CMT  Uterus: Non-enlarged, mobile, normal contour.    Adnexa: ovaries non-enlarged, no adnexal masses  Rectal: deferred  Lymphatic: no evidence of inguinal lymphadenopathy Extremities: no edema, erythema, or tenderness, spots of psoriasis on lower ext.  Neurologic: Grossly intact Psychiatric: mood appropriate, affect full   Assessment: 32 y.o. G3P2002 routine annual exam  Plan: Problem List Items Addressed This Visit    None    Visit Diagnoses    Well woman exam with routine gynecological exam    -  Primary   Relevant Orders   IGP, Aptima HPV   Cervical cancer screening       Relevant Orders   IGP, Aptima HPV      1) STI screening was offered and declined  2)  ASCCP guidelines and rational discussed.  Patient opts for every 3 years screening interval  3) Contraception - bilateral tubal ligation  4) Routine healthcare maintenance including cholesterol, diabetes screening discussed managed by PCP   5) Increase intake of omega 3 fatty acids for skin health  6) Return in 1 year (on 03/22/2018).   Tresea Mall, CNM [facility], [facility] 03/22/2017, 9:15 AM

## 2017-03-26 LAB — IGP, APTIMA HPV
HPV Aptima: NEGATIVE
PAP SMEAR COMMENT: 0

## 2017-07-10 DIAGNOSIS — J020 Streptococcal pharyngitis: Secondary | ICD-10-CM | POA: Diagnosis not present

## 2017-07-10 DIAGNOSIS — J029 Acute pharyngitis, unspecified: Secondary | ICD-10-CM | POA: Diagnosis not present

## 2017-08-16 ENCOUNTER — Ambulatory Visit: Payer: 59 | Admitting: Advanced Practice Midwife

## 2017-08-20 ENCOUNTER — Ambulatory Visit: Payer: 59 | Admitting: Psychology

## 2017-08-20 DIAGNOSIS — F410 Panic disorder [episodic paroxysmal anxiety] without agoraphobia: Secondary | ICD-10-CM | POA: Diagnosis not present

## 2017-08-22 ENCOUNTER — Encounter: Payer: Self-pay | Admitting: Advanced Practice Midwife

## 2017-08-23 ENCOUNTER — Other Ambulatory Visit (HOSPITAL_COMMUNITY)
Admission: RE | Admit: 2017-08-23 | Discharge: 2017-08-23 | Disposition: A | Discharge status: Home / Self Care | Payer: 59 | Source: Ambulatory Visit | Attending: Advanced Practice Midwife | Admitting: Advanced Practice Midwife

## 2017-08-23 ENCOUNTER — Ambulatory Visit: Payer: 59 | Admitting: Advanced Practice Midwife

## 2017-08-23 ENCOUNTER — Encounter: Payer: Self-pay | Admitting: Advanced Practice Midwife

## 2017-08-23 VITALS — BP 110/70 | HR 67 | Ht 60.0 in | Wt 146.0 lb

## 2017-08-23 DIAGNOSIS — Z113 Encounter for screening for infections with a predominantly sexual mode of transmission: Secondary | ICD-10-CM | POA: Insufficient documentation

## 2017-08-23 NOTE — Progress Notes (Signed)
   Patient ID: Beth Bass, female   DOB: 07/04/1985, 32 y.o.   MRN: [id number]  Reason for Consult: Vaginal irritation (itching) and STD check     Subjective:     HPI:   Beth Bass is a 32 y.o. female in the office today with mild vaginitis symptoms: itching, irritation, discharge that come and go over the past month. About a month ago her partner was sexually involved with another woman. She and her partner used condoms occasionally. [name]'s main concern today is she requests STD testing. She doesn't think she has vaginitis currently, she just wants to make sure she is clear of STDs. She has no other concerns today.   Past Medical History:  Diagnosis Date  . Molar pregnancy    History reviewed. No pertinent family history. Past Surgical History:  Procedure Laterality Date  . DILATION AND CURETTAGE OF UTERUS     Molar Pregnancy  . TUBAL LIGATION  2013    Short Social History:  Social History   Tobacco Use  . Smoking status: Current Every Day Smoker  . Smokeless tobacco: Never Used  Substance Use Topics  . Alcohol use: Yes    Allergies  Allergen Reactions  . Penicillins Hives    Current Outpatient Medications  Medication Sig Dispense Refill  . omeprazole (PRILOSEC) 40 MG capsule      No current facility-administered medications for this visit.     Review of Systems  Constitutional:  Constitutional negative. HENT: HENT negative.  Eyes: Eyes negative.  Respiratory: Respiratory negative.  Cardiovascular: Cardiovascular negative.  GI: Gastrointestinal negative.  GU: Genitourinary negative. Musculoskeletal: Musculoskeletal negative.  Skin: Skin negative.  Neurological: Neurological negative. Hematologic: Hematologic/lymphatic negative.  Psychiatric: Psychiatric negative.        Objective:  Objective   Vitals:   08/23/17 0941  BP: 110/70  Pulse: 67  Weight: 146 lb (66.2 kg)  Height: 5' (1.524 m)   Body mass index is 28.51 kg/m.  Vital  Signs: BP 110/70 (BP Location: Left Arm, Patient Position: Sitting, Cuff Size: Normal)   Pulse 67   Ht 5' (1.524 m)   Wt 146 lb (66.2 kg)   LMP 08/12/2017 (Exact Date) Comment: Tubal Ligation  BMI 28.51 kg/m  Constitutional: Well nourished, well developed female in no acute distress.  HEENT: normal Skin: Warm and dry.  Cardiovascular: Regular rate and rhythm.   Respiratory: Clear to auscultation bilateral. Normal respiratory effort Psych: Alert and Oriented x3. No memory deficits. Normal mood and affect.    Pelvic exam: is not limited by body habitus EGBUS: within normal limits Vagina: within normal limits and with normal mucosa, scant white discharge Cervix: appearance normal  Data: Wet prep:  Negative for Clue Cells, Whiff, Yeast     Assessment/Plan:     32 yo G3 44P2012 female in office for STD testing NuSwab for STDs/Vaginitis    Tresea MallJane Thara Searing CNM

## 2017-08-23 NOTE — Patient Instructions (Signed)

## 2017-08-26 LAB — CERVICOVAGINAL ANCILLARY ONLY
Bacterial vaginitis: NEGATIVE
Candida vaginitis: NEGATIVE
Chlamydia: NEGATIVE
NEISSERIA GONORRHEA: NEGATIVE
TRICH (WINDOWPATH): NEGATIVE

## 2017-09-06 ENCOUNTER — Ambulatory Visit: Payer: 59 | Admitting: Psychology

## 2017-09-13 ENCOUNTER — Ambulatory Visit: Payer: Self-pay | Admitting: Psychology

## 2017-10-04 DIAGNOSIS — I10 Essential (primary) hypertension: Secondary | ICD-10-CM | POA: Diagnosis not present

## 2017-10-04 DIAGNOSIS — E7849 Other hyperlipidemia: Secondary | ICD-10-CM | POA: Diagnosis not present

## 2017-10-04 DIAGNOSIS — R5381 Other malaise: Secondary | ICD-10-CM | POA: Diagnosis not present

## 2017-10-18 DIAGNOSIS — E639 Nutritional deficiency, unspecified: Secondary | ICD-10-CM | POA: Diagnosis not present

## 2017-10-18 DIAGNOSIS — Z0000 Encounter for general adult medical examination without abnormal findings: Secondary | ICD-10-CM | POA: Diagnosis not present

## 2017-10-18 DIAGNOSIS — J329 Chronic sinusitis, unspecified: Secondary | ICD-10-CM | POA: Diagnosis not present

## 2017-10-18 DIAGNOSIS — E889 Metabolic disorder, unspecified: Secondary | ICD-10-CM | POA: Diagnosis not present

## 2017-11-01 DIAGNOSIS — L400 Psoriasis vulgaris: Secondary | ICD-10-CM | POA: Diagnosis not present

## 2017-12-28 DIAGNOSIS — B373 Candidiasis of vulva and vagina: Secondary | ICD-10-CM | POA: Diagnosis not present

## 2018-01-10 DIAGNOSIS — E8881 Metabolic syndrome: Secondary | ICD-10-CM | POA: Diagnosis not present

## 2018-01-10 DIAGNOSIS — J309 Allergic rhinitis, unspecified: Secondary | ICD-10-CM | POA: Diagnosis not present

## 2018-01-10 DIAGNOSIS — H01009 Unspecified blepharitis unspecified eye, unspecified eyelid: Secondary | ICD-10-CM | POA: Diagnosis not present

## 2018-01-21 ENCOUNTER — Ambulatory Visit: Payer: 59 | Admitting: Obstetrics and Gynecology

## 2018-01-31 ENCOUNTER — Encounter: Payer: Self-pay | Admitting: Obstetrics and Gynecology

## 2018-01-31 ENCOUNTER — Other Ambulatory Visit (HOSPITAL_COMMUNITY)
Admission: RE | Admit: 2018-01-31 | Discharge: 2018-01-31 | Disposition: A | Discharge status: Home / Self Care | Payer: 59 | Source: Ambulatory Visit | Attending: Obstetrics and Gynecology | Admitting: Obstetrics and Gynecology

## 2018-01-31 ENCOUNTER — Ambulatory Visit: Payer: 59 | Admitting: Obstetrics and Gynecology

## 2018-01-31 VITALS — BP 130/90 | HR 68 | Ht 60.0 in | Wt 152.0 lb

## 2018-01-31 DIAGNOSIS — Z5181 Encounter for therapeutic drug level monitoring: Secondary | ICD-10-CM | POA: Diagnosis not present

## 2018-01-31 DIAGNOSIS — R350 Frequency of micturition: Secondary | ICD-10-CM

## 2018-01-31 DIAGNOSIS — N898 Other specified noninflammatory disorders of vagina: Secondary | ICD-10-CM

## 2018-01-31 DIAGNOSIS — R21 Rash and other nonspecific skin eruption: Secondary | ICD-10-CM

## 2018-01-31 DIAGNOSIS — L400 Psoriasis vulgaris: Secondary | ICD-10-CM | POA: Diagnosis not present

## 2018-01-31 DIAGNOSIS — Z113 Encounter for screening for infections with a predominantly sexual mode of transmission: Secondary | ICD-10-CM

## 2018-01-31 LAB — POCT URINALYSIS DIPSTICK
Bilirubin, UA: NEGATIVE
Blood, UA: NEGATIVE
Glucose, UA: NEGATIVE
KETONES UA: NEGATIVE
Leukocytes, UA: NEGATIVE
Nitrite, UA: NEGATIVE
Protein, UA: NEGATIVE
SPEC GRAV UA: 1.01 (ref 1.010–1.025)
pH, UA: 6.5 (ref 5.0–8.0)

## 2018-01-31 LAB — POCT WET PREP WITH KOH
Clue Cells Wet Prep HPF POC: NEGATIVE
KOH PREP POC: NEGATIVE
Trichomonas, UA: NEGATIVE
Yeast Wet Prep HPF POC: NEGATIVE

## 2018-01-31 NOTE — Progress Notes (Signed)
Corky Downs, MD   Chief Complaint  Patient presents with  . Vaginal Discharge    discharge, no odor/itching/irritation x  2-3 weeks, about 2 weeks ago she felt a lump inside vaginal area, not there anymore, concerned about an entire body rash x 2 weeks    HPI:      [name] is a 33 y.o. [id number] who LMP was Patient's last menstrual period was 01/11/2018 (approximate)., presents today for STD testing. Pt had unfaithful partner recently. She has noticed a small increase in  vag d/c, without irritation/odor, for 2-3 wks. She has had urinary frequency/dysuria/urgency and pelvic pressure intermittently since then too. No LBP, fevers. Hx of trich many yrs ago. Treated for yeast vag with diflucan 12/28/17 with sx relief. Neg pap 03/22/17. S/p TL.  Pt also with itchy rash on upper body (back, bilat arms, abd). Hx of psoriasis, usually on legs; was on otezla but stopped it recently. Has derm appt today. No new soaps/detergents/meds. Treating with hydrocortisone crm. Pt concerned about secondary syphilis.    Past Medical History:  Diagnosis Date  . Molar pregnancy     Past Surgical History:  Procedure Laterality Date  . DILATION AND CURETTAGE OF UTERUS     Molar Pregnancy  . TUBAL LIGATION  2013    History reviewed. No pertinent family history.  Social History   Socioeconomic History  . Marital status: Single    Spouse name: Not on file  . Number of children: Not on file  . Years of education: Not on file  . Highest education level: Not on file  Occupational History  . Not on file  Social Needs  . Financial resource strain: Not on file  . Food insecurity:    Worry: Not on file    Inability: Not on file  . Transportation needs:    Medical: Not on file    Non-medical: Not on file  Tobacco Use  . Smoking status: Current Every Day Smoker  . Smokeless tobacco: Never Used  Substance and Sexual Activity  . Alcohol use: Yes  . Drug use: No  . Sexual activity: Not  Currently    Birth control/protection: Surgical  Lifestyle  . Physical activity:    Days per week: Not on file    Minutes per session: Not on file  . Stress: Not on file  Relationships  . Social connections:    Talks on phone: Not on file    Gets together: Not on file    Attends religious service: Not on file    Active member of club or organization: Not on file    Attends meetings of clubs or organizations: Not on file    Relationship status: Not on file  . Intimate partner violence:    Fear of current or ex partner: Not on file    Emotionally abused: Not on file    Physically abused: Not on file    Forced sexual activity: Not on file  Other Topics Concern  . Not on file  Social History Narrative  . Not on file    Outpatient Medications Prior to Visit  Medication Sig Dispense Refill  . omeprazole (PRILOSEC) 40 MG capsule     . Apremilast (OTEZLA) 30 MG TABS Take by mouth.     No facility-administered medications prior to visit.       ROS:  Review of Systems  Constitutional: Negative for fever.  Gastrointestinal: Negative for blood in stool, constipation, diarrhea, nausea  and vomiting.  Genitourinary: Positive for dysuria, frequency, urgency and vaginal discharge. Negative for dyspareunia, flank pain, hematuria, vaginal bleeding and vaginal pain.  Musculoskeletal: Negative for back pain.  Skin: Positive for rash.  Psychiatric/Behavioral: Positive for agitation and dysphoric mood.    OBJECTIVE:   Vitals:  BP 130/90   Pulse 68   Ht 5' (1.524 m)   Wt 152 lb (68.9 kg)   LMP 01/11/2018 (Approximate)   BMI 29.69 kg/m   Physical Exam Vitals signs reviewed.  Constitutional:      Appearance: She is well-developed.  Pulmonary:     Effort: Pulmonary effort is normal.  Genitourinary:    General: Normal vulva.     Pubic Area: No rash.      Labia:        Right: No rash, tenderness or lesion.        Left: No rash, tenderness or lesion.      Vagina: Normal. No  vaginal discharge, erythema or tenderness.     Cervix: Normal.     Uterus: Normal. Not enlarged and not tender.      Adnexa: Right adnexa normal and left adnexa normal.       Right: No mass or tenderness.         Left: No mass or tenderness.    Musculoskeletal: Normal range of motion.  Skin:    Findings: Rash present. Rash is macular, papular and scaling.     Comments: DIFFUSE ERYTHEMATOUS LESIONS ON BILAT ARMS, BACK, ABD; SOME WITH SCALE, OTHERS AS MACULES  Neurological:     Mental Status: She is alert and oriented to person, place, and time.  Psychiatric:        Behavior: Behavior normal.        Thought Content: Thought content normal.     Results: Results for orders placed or performed in visit on 01/31/18 (from the past 24 hour(s))  POCT Wet Prep with KOH     Status: Normal   Collection Time: 01/31/18 10:29 AM  Result Value Ref Range   Trichomonas, UA Negative    Clue Cells Wet Prep HPF POC neg    Epithelial Wet Prep HPF POC     Yeast Wet Prep HPF POC neg    Bacteria Wet Prep HPF POC     RBC Wet Prep HPF POC     WBC Wet Prep HPF POC     KOH Prep POC Negative Negative  POCT Urinalysis Dipstick     Status: Normal   Collection Time: 01/31/18 10:29 AM  Result Value Ref Range   Color, UA pale yellow    Clarity, UA clear    Glucose, UA Negative Negative   Bilirubin, UA neg    Ketones, UA neg    Spec Grav, UA 1.010 1.010 - 1.025   Blood, UA neg    pH, UA 6.5 5.0 - 8.0   Protein, UA Negative Negative   Urobilinogen, UA     Nitrite, UA neg    Leukocytes, UA Negative Negative   Appearance     Odor       Assessment/Plan: Vaginal discharge - Neg wet prep. Check STD testing. Will call with results. If neg, reassurance.  - Plan: POCT Wet Prep with KOH  Screening for STD (sexually transmitted disease) - Plan: HIV Antibody (routine testing w rflx), RPR, Hepatitis C antibody, Cervicovaginal ancillary only  Urinary frequency - Neg UA. Check STD testing. F/u prn.  - Plan:  POCT Urinalysis Dipstick  Rash -  Pt has derm appt today.     Return if symptoms worsen or fail to improve.  Alicia B. Copland, PA-C 01/31/2018 10:33 AM

## 2018-01-31 NOTE — Patient Instructions (Signed)
I value your feedback and entrusting us with your care. If you get a [place] patient survey, I would appreciate you taking the time to let us know about your experience today. Thank you! 

## 2018-02-01 LAB — RPR: RPR Ser Ql: NONREACTIVE

## 2018-02-01 LAB — HIV ANTIBODY (ROUTINE TESTING W REFLEX): HIV Screen 4th Generation wRfx: NONREACTIVE

## 2018-02-01 LAB — HEPATITIS C ANTIBODY

## 2018-02-03 LAB — CERVICOVAGINAL ANCILLARY ONLY
Chlamydia: NEGATIVE
NEISSERIA GONORRHEA: NEGATIVE
Trichomonas: NEGATIVE

## 2018-02-04 ENCOUNTER — Telehealth: Payer: Self-pay | Admitting: Obstetrics and Gynecology

## 2018-02-04 NOTE — Telephone Encounter (Signed)
Pt calling you back

## 2018-02-04 NOTE — Progress Notes (Signed)
Pls let pt know all STD testing was neg. F/u prn.

## 2018-02-04 NOTE — Progress Notes (Signed)
Called pt, no answer, LVMTRC. 

## 2018-02-04 NOTE — Progress Notes (Signed)
Tried again, no answer, LVMTRC. 

## 2018-02-04 NOTE — Telephone Encounter (Signed)
Patient is calling for labs results. Please advise. 

## 2018-02-05 NOTE — Telephone Encounter (Signed)
Pt aware of lab results 

## 2018-02-13 DIAGNOSIS — J029 Acute pharyngitis, unspecified: Secondary | ICD-10-CM | POA: Diagnosis not present

## 2018-04-05 DIAGNOSIS — B373 Candidiasis of vulva and vagina: Secondary | ICD-10-CM | POA: Diagnosis not present

## 2018-09-01 ENCOUNTER — Other Ambulatory Visit: Payer: Self-pay

## 2018-09-01 DIAGNOSIS — Z20822 Contact with and (suspected) exposure to covid-19: Secondary | ICD-10-CM

## 2018-09-02 LAB — NOVEL CORONAVIRUS, NAA: SARS-CoV-2, NAA: NOT DETECTED

## 2018-10-13 ENCOUNTER — Other Ambulatory Visit: Payer: Self-pay | Admitting: *Deleted

## 2018-10-13 DIAGNOSIS — Z20822 Contact with and (suspected) exposure to covid-19: Secondary | ICD-10-CM

## 2018-10-14 LAB — NOVEL CORONAVIRUS, NAA: SARS-CoV-2, NAA: NOT DETECTED

## 2018-11-06 NOTE — Patient Instructions (Signed)
I value your feedback and entrusting us with your care. If you get a [facility] patient survey, I would appreciate you taking the time to let us know about your experience today. Thank you! 

## 2018-11-06 NOTE — Progress Notes (Signed)
PCP:  Cletis Athens, MD   Chief Complaint  Patient presents with  . Gynecologic Exam     HPI:      [name] is a 33 y.o. [id number] who LMP was Patient's last menstrual period was 11/03/2018 (approximate)., presents today for her annual examination.  Her menses are regular every 28-30 days, lasting 3-6 days.  Dysmenorrhea mild, occurring premenstrually. She does not have intermenstrual bleeding.  Sex activity: single partner, contraception - tubal ligation.  Last Pap: March 22, 2017  Results were: no abnormalities /neg HPV DNA   There is no FH of breast cancer. There is no FH of ovarian cancer. The patient does do self-breast exams.  Tobacco use: The patient currently smokes 1/2  packs of cigarettes per day for the past many years. Trying to quit Alcohol use: none No drug use.  Exercise: moderately active  She does get adequate calcium but not Vitamin D in her diet.   Past Medical History:  Diagnosis Date  . GERD (gastroesophageal reflux disease)   . Molar pregnancy   . Psoriasis   . Tobacco use     Past Surgical History:  Procedure Laterality Date  . DILATION AND CURETTAGE OF UTERUS     Molar Pregnancy  . TUBAL LIGATION  2013    Family History  Problem Relation Age of Onset  . Breast cancer Neg Hx   . Ovarian cancer Neg Hx     Social History   Socioeconomic History  . Marital status: Single    Spouse name: Not on file  . Number of children: Not on file  . Years of education: Not on file  . Highest education level: Not on file  Occupational History  . Not on file  Social Needs  . Financial resource strain: Not on file  . Food insecurity    Worry: Not on file    Inability: Not on file  . Transportation needs    Medical: Not on file    Non-medical: Not on file  Tobacco Use  . Smoking status: Current Every Day Smoker  . Smokeless tobacco: Never Used  Substance and Sexual Activity  . Alcohol use: Yes  . Drug use: No  . Sexual activity:  Yes    Birth control/protection: Surgical    Comment: Tubal ligation  Lifestyle  . Physical activity    Days per week: Not on file    Minutes per session: Not on file  . Stress: Not on file  Relationships  . Social Herbalist on phone: Not on file    Gets together: Not on file    Attends religious service: Not on file    Active member of club or organization: Not on file    Attends meetings of clubs or organizations: Not on file    Relationship status: Not on file  . Intimate partner violence    Fear of current or ex partner: Not on file    Emotionally abused: Not on file    Physically abused: Not on file    Forced sexual activity: Not on file  Other Topics Concern  . Not on file  Social History Narrative  . Not on file     Current Outpatient Medications:  .  Adalimumab (HUMIRA PEN [place]), Inject into the skin. Every 2 weeks, 80 mg pen., Disp: , Rfl:  .  augmented betamethasone dipropionate (DIPROLENE-AF) 0.05 % cream, APPLY THIN FILM TO AFFECTED NON FACIAL SITES TWICE DAILY  UNTIL CLEAR, Disp: , Rfl:  .  omeprazole (PRILOSEC) 40 MG capsule, , Disp: , Rfl:  .  triamcinolone (KENALOG) 0.025 % cream, APPLY TO AFFECTED AREA TWICE A DAY, Disp: , Rfl:    ROS:  Review of Systems  Constitutional: Positive for fatigue. Negative for fever and unexpected weight change.  Respiratory: Negative for cough, shortness of breath and wheezing.   Cardiovascular: Negative for chest pain, palpitations and leg swelling.  Gastrointestinal: Negative for blood in stool, constipation, diarrhea, nausea and vomiting.  Endocrine: Negative for cold intolerance, heat intolerance and polyuria.  Genitourinary: Negative for dyspareunia, dysuria, flank pain, frequency, genital sores, hematuria, menstrual problem, pelvic pain, urgency, vaginal bleeding, vaginal discharge and vaginal pain.  Musculoskeletal: Negative for back pain, joint swelling and myalgias.  Skin: Negative for rash.  Neurological:  Negative for dizziness, syncope, light-headedness, numbness and headaches.  Hematological: Negative for adenopathy.  Psychiatric/Behavioral: Positive for agitation. Negative for confusion, sleep disturbance and suicidal ideas. The patient is not nervous/anxious.   BREAST: No symptoms   Objective: BP 100/80   Ht 5' (1.524 m)   Wt 154 lb (69.9 kg)   LMP 11/03/2018 (Approximate)   BMI 30.08 kg/m    Physical Exam Constitutional:      Appearance: She is well-developed.  Genitourinary:     Vulva, vagina, cervix, uterus, right adnexa and left adnexa normal.     No vulval lesion or tenderness noted.     No vaginal discharge, erythema or tenderness.     No cervical polyp.     Uterus is not enlarged or tender.     No right or left adnexal mass present.     Right adnexa not tender.     Left adnexa not tender.  Neck:     Musculoskeletal: Normal range of motion.     Thyroid: No thyromegaly.  Cardiovascular:     Rate and Rhythm: Normal rate and regular rhythm.     Heart sounds: Normal heart sounds. No murmur.  Pulmonary:     Effort: Pulmonary effort is normal.     Breath sounds: Normal breath sounds.  Chest:     Breasts:        Right: No mass, nipple discharge, skin change or tenderness.        Left: No mass, nipple discharge, skin change or tenderness.  Abdominal:     Palpations: Abdomen is soft.     Tenderness: There is no abdominal tenderness. There is no guarding.  Musculoskeletal: Normal range of motion.  Neurological:     General: No focal deficit present.     Mental Status: She is alert and oriented to person, place, and time.     Cranial Nerves: No cranial nerve deficit.  Skin:    General: Skin is warm and dry.  Psychiatric:        Mood and Affect: Mood normal.        Behavior: Behavior normal.        Thought Content: Thought content normal.        Judgment: Judgment normal.  Vitals signs reviewed.     Assessment/Plan: Encounter for annual routine gynecological  examination    GYN counsel adequate intake of calcium and vitamin D, diet and exercise, tobacco cessation     F/U  Return in about 1 year (around 11/10/2019).  Alicia B. Copland, PA-C 11/10/2018 8:26 AM

## 2018-11-10 ENCOUNTER — Other Ambulatory Visit: Payer: Self-pay

## 2018-11-10 ENCOUNTER — Encounter: Payer: Self-pay | Admitting: Obstetrics and Gynecology

## 2018-11-10 ENCOUNTER — Ambulatory Visit (INDEPENDENT_AMBULATORY_CARE_PROVIDER_SITE_OTHER): Payer: 59 | Admitting: Obstetrics and Gynecology

## 2018-11-10 VITALS — BP 100/80 | Ht 60.0 in | Wt 154.0 lb

## 2018-11-10 DIAGNOSIS — Z01419 Encounter for gynecological examination (general) (routine) without abnormal findings: Secondary | ICD-10-CM | POA: Diagnosis not present

## 2019-01-05 ENCOUNTER — Ambulatory Visit (INDEPENDENT_AMBULATORY_CARE_PROVIDER_SITE_OTHER): Payer: 59 | Admitting: Psychology

## 2019-01-05 DIAGNOSIS — F410 Panic disorder [episodic paroxysmal anxiety] without agoraphobia: Secondary | ICD-10-CM | POA: Diagnosis not present

## 2019-01-14 ENCOUNTER — Other Ambulatory Visit: Payer: 59

## 2019-01-15 ENCOUNTER — Other Ambulatory Visit: Payer: 59

## 2019-02-06 ENCOUNTER — Ambulatory Visit (INDEPENDENT_AMBULATORY_CARE_PROVIDER_SITE_OTHER): Payer: Self-pay | Admitting: Psychology

## 2019-02-06 DIAGNOSIS — F410 Panic disorder [episodic paroxysmal anxiety] without agoraphobia: Secondary | ICD-10-CM

## 2019-05-19 ENCOUNTER — Other Ambulatory Visit: Payer: Self-pay

## 2019-05-19 ENCOUNTER — Ambulatory Visit (INDEPENDENT_AMBULATORY_CARE_PROVIDER_SITE_OTHER): Payer: BC Managed Care – PPO | Admitting: Certified Nurse Midwife

## 2019-05-19 ENCOUNTER — Encounter: Payer: Self-pay | Admitting: Certified Nurse Midwife

## 2019-05-19 VITALS — BP 128/84 | Ht 60.0 in | Wt 152.0 lb

## 2019-05-19 DIAGNOSIS — N898 Other specified noninflammatory disorders of vagina: Secondary | ICD-10-CM

## 2019-05-19 DIAGNOSIS — R300 Dysuria: Secondary | ICD-10-CM | POA: Diagnosis not present

## 2019-05-19 DIAGNOSIS — L409 Psoriasis, unspecified: Secondary | ICD-10-CM | POA: Diagnosis not present

## 2019-05-19 DIAGNOSIS — Z113 Encounter for screening for infections with a predominantly sexual mode of transmission: Secondary | ICD-10-CM

## 2019-05-19 LAB — POCT URINALYSIS DIPSTICK
Bilirubin, UA: NEGATIVE
Blood, UA: NEGATIVE
Glucose, UA: NEGATIVE
Leukocytes, UA: NEGATIVE
Nitrite, UA: NEGATIVE
Protein, UA: NEGATIVE
Spec Grav, UA: >=1.03 — AB (ref 1.010–1.025)
Urobilinogen, UA: 0.2 E.U./dL
pH, UA: 5 (ref 5.0–8.0)

## 2019-05-19 NOTE — Progress Notes (Signed)
Obstetrics & Gynecology Office Visit   Chief Complaint:  Chief Complaint  Patient presents with  . Vaginal Discharge    History of Present Illness: 34 year old G3 P2012 presents with complaints of a vaginal discharge since last week. It was creamy at first then became more "runny". Has had some intermittent itching. Denies any recent antibiotic use. Has been in a bathing suit at the beach and has been sweating at the gym. No new soaps. No new partners, but found out her partner went to get checked for STDs recently. Occasionally has dysuria Current contraception: TL LMP 2 April   Review of Systems:  ROS General: negative for fever GU: positive for occ dysuria, vaginal discharge , vulvar itching  Past Medical History:  Past Medical History:  Diagnosis Date  . GERD (gastroesophageal reflux disease)   . Molar pregnancy   . Psoriasis   . Tobacco use     Past Surgical History:  Past Surgical History:  Procedure Laterality Date  . DILATION AND CURETTAGE OF UTERUS     Molar Pregnancy  . TUBAL LIGATION  2013    Gynecologic History: Patient's last menstrual period was 05/01/2019.  Obstetric History: [id number]  Family History:  Family History  Problem Relation Age of Onset  . Breast cancer Neg Hx   . Ovarian cancer Neg Hx     Social History:  Social History   Socioeconomic History  . Marital status: Single    Spouse name: Not on file  . Number of children: 2  . Years of education: Not on file  . Highest education level: Not on file  Occupational History  . Not on file  Tobacco Use  . Smoking status: Current Every Day Smoker  . Smokeless tobacco: Never Used  Substance and Sexual Activity  . Alcohol use: Yes  . Drug use: No  . Sexual activity: Yes    Birth control/protection: Surgical    Comment: Tubal ligation  Other Topics Concern  . Not on file  Social History Narrative  . Not on file   Social Determinants of Health   Financial Resource Strain:   .  Difficulty of Paying Living Expenses:   Food Insecurity:   . Worried About Programme researcher, broadcasting/film/video in the Last Year:   . Barista in the Last Year:   Transportation Needs:   . Freight forwarder (Medical):   Marland Kitchen Lack of Transportation (Non-Medical):   Physical Activity:   . Days of Exercise per Week:   . Minutes of Exercise per Session:   Stress:   . Feeling of Stress :   Social Connections:   . Frequency of Communication with Friends and Family:   . Frequency of Social Gatherings with Friends and Family:   . Attends Religious Services:   . Active Member of Clubs or Organizations:   . Attends Banker Meetings:   Marland Kitchen Marital Status:   Intimate Partner Violence:   . Fear of Current or Ex-Partner:   . Emotionally Abused:   Marland Kitchen Physically Abused:   . Sexually Abused:     Allergies:  Allergies  Allergen Reactions  . Penicillins Hives    Medications: Prior to Admission medications   Medication Sig Start Date End Date Taking? Authorizing Provider  Adalimumab (HUMIRA PEN [place]) Inject into the skin. Every 2 weeks, 80 mg pen.   Yes [provider]  augmented betamethasone dipropionate (DIPROLENE-AF) 0.05 % cream APPLY THIN FILM TO AFFECTED NON  FACIAL SITES TWICE DAILY UNTIL CLEAR 10/08/18  Yes [provider]  omeprazole (PRILOSEC) 40 MG capsule  03/18/17  Yes [provider]  triamcinolone (KENALOG) 0.025 % cream APPLY TO AFFECTED AREA TWICE A DAY 10/03/18  Yes [provider]    Physical Exam Vitals:BP 128/84   Ht 5' (1.524 m)   Wt 152 lb (68.9 kg)   LMP 05/01/2019   BMI 29.69 kg/m    Physical Exam  Constitutional: She is oriented to person, place, and time. She appears well-developed and well-nourished. No distress.  Genitourinary:    Genitourinary Comments: Vulva: no inflammation or lesions Vagina: small amount white mucoepithelial discharge, no inflammation Cervix: no lesions   Neurological: She is alert and oriented to  person, place, and time.  Skin: Skin is warm and dry.  Psychiatric: She has a normal mood and affect.   Results for orders placed or performed in visit on 05/19/19 (from the past 72 hour(s))  POCT Urinalysis Dipstick     Status: Abnormal   Collection Time: 05/19/19  9:14 AM  Result Value Ref Range   Color, UA yellow    Clarity, UA clear    Glucose, UA Negative Negative   Bilirubin, UA negative    Ketones, UA 1+    Spec Grav, UA >=1.030 (A) 1.010 - 1.025   Blood, UA negative    pH, UA 5.0 5.0 - 8.0   Protein, UA Negative Negative   Urobilinogen, UA 0.2 0.2 or 1.0 E.U./dL   Nitrite, UA negative    Leukocytes, UA Negative Negative   Appearance     Odor                                  POCT Wet Prep [place])     Status: Normal   Collection Time: 05/21/19  8:13 PM  Result Value Ref Range   Source Wet Prep POC vagina    WBC, Wet Prep HPF POC     Bacteria Wet Prep HPF POC     BACTERIA WET PREP MORPHOLOGY POC     Clue Cells Wet Prep HPF POC None None   Clue Cells Wet Prep Whiff POC     Yeast Wet Prep HPF POC None None   KOH Wet Prep POC     Trichomonas Wet Prep HPF POC Absent Absent    Assessment: 34 y.o. [id number] with no evidence of vaginitis or UTI R/O STD   Plan: Problem List Items Addressed This Visit    None    Visit Diagnoses    Dysuria    -  Primary   Relevant Orders   POCT Urinalysis Dipstick (Completed)   Vaginal discharge       Relevant Orders   Chlamydia/Gonococcus/Trichomonas, NAA (Completed)   Screening for STD (sexually transmitted disease)       Relevant Orders   Chlamydia/Gonococcus/Trichomonas, NAA (Completed)     RTO prn.  Dalia Heading, CNM

## 2019-05-21 ENCOUNTER — Encounter: Payer: Self-pay | Admitting: Certified Nurse Midwife

## 2019-05-21 DIAGNOSIS — L409 Psoriasis, unspecified: Secondary | ICD-10-CM | POA: Insufficient documentation

## 2019-05-21 DIAGNOSIS — K219 Gastro-esophageal reflux disease without esophagitis: Secondary | ICD-10-CM | POA: Insufficient documentation

## 2019-05-21 LAB — CHLAMYDIA/GONOCOCCUS/TRICHOMONAS, NAA
Chlamydia by NAA: NEGATIVE
Gonococcus by NAA: NEGATIVE
Trich vag by NAA: NEGATIVE

## 2019-05-21 LAB — POCT WET PREP (WET MOUNT): Trichomonas Wet Prep HPF POC: ABSENT

## 2019-05-27 ENCOUNTER — Encounter: Payer: Self-pay | Admitting: Certified Nurse Midwife

## 2019-06-03 ENCOUNTER — Ambulatory Visit: Payer: Self-pay | Admitting: Obstetrics and Gynecology

## 2019-07-09 DIAGNOSIS — L309 Dermatitis, unspecified: Secondary | ICD-10-CM | POA: Diagnosis not present

## 2019-07-24 DIAGNOSIS — L400 Psoriasis vulgaris: Secondary | ICD-10-CM | POA: Diagnosis not present

## 2019-07-24 DIAGNOSIS — L218 Other seborrheic dermatitis: Secondary | ICD-10-CM | POA: Diagnosis not present

## 2019-11-18 ENCOUNTER — Ambulatory Visit: Payer: BC Managed Care – PPO | Admitting: Obstetrics and Gynecology

## 2019-12-01 ENCOUNTER — Ambulatory Visit (INDEPENDENT_AMBULATORY_CARE_PROVIDER_SITE_OTHER): Payer: BC Managed Care – PPO | Admitting: Obstetrics and Gynecology

## 2019-12-01 ENCOUNTER — Other Ambulatory Visit (HOSPITAL_COMMUNITY)
Admission: RE | Admit: 2019-12-01 | Discharge: 2019-12-01 | Disposition: A | Discharge status: Home / Self Care | Payer: BC Managed Care – PPO | Source: Ambulatory Visit | Attending: Obstetrics and Gynecology | Admitting: Obstetrics and Gynecology

## 2019-12-01 ENCOUNTER — Encounter: Payer: Self-pay | Admitting: Obstetrics and Gynecology

## 2019-12-01 ENCOUNTER — Other Ambulatory Visit: Payer: Self-pay

## 2019-12-01 VITALS — BP 110/80 | Ht 60.0 in | Wt 151.0 lb

## 2019-12-01 DIAGNOSIS — Z113 Encounter for screening for infections with a predominantly sexual mode of transmission: Secondary | ICD-10-CM | POA: Insufficient documentation

## 2019-12-01 DIAGNOSIS — F3281 Premenstrual dysphoric disorder: Secondary | ICD-10-CM | POA: Diagnosis not present

## 2019-12-01 DIAGNOSIS — Z202 Contact with and (suspected) exposure to infections with a predominantly sexual mode of transmission: Secondary | ICD-10-CM | POA: Diagnosis not present

## 2019-12-01 DIAGNOSIS — Z23 Encounter for immunization: Secondary | ICD-10-CM | POA: Diagnosis not present

## 2019-12-01 DIAGNOSIS — Z01419 Encounter for gynecological examination (general) (routine) without abnormal findings: Secondary | ICD-10-CM | POA: Diagnosis not present

## 2019-12-01 MED ORDER — SERTRALINE HCL 50 MG PO TABS: 50.0000 mg | ORAL_TABLET | Freq: Every day | ORAL | 5 refills | Status: DC

## 2019-12-01 NOTE — Progress Notes (Signed)
PCP:  Corky Downs, MD   Chief Complaint  Patient presents with  . Gynecologic Exam  . Injections    flu     HPI:      [name] is a 34 y.o. [id number] who LMP was Patient's last menstrual period was 11/20/2019 (exact date)., presents today for her annual examination.  Her menses are regular every 28-30 days, lasting 4-5 days.  Dysmenorrhea mild, occurring premenstrually. She does not have intermenstrual bleeding. Hx of PMDD sx luteal phase (see GAD/PHQ below); feels fine rest of the month. Sx worse past few months and feels like she can't manage it on her own anymore. She is exercising.   Sex activity: single partner, contraception - tubal ligation. Would like full STD testing. Hx of trich in distant past. Last Pap: March 22, 2017  Results were: no abnormalities /neg HPV DNA   There is no FH of breast cancer. There is no FH of ovarian cancer. The patient does do self-breast exams.  Tobacco use: The patient currently smokes 1/4  packs of cigarettes per day  (was 1/2 ppd) for the past many years.  Alcohol use: none No drug use.  Exercise: moderately active  She does get adequate calcium but not Vitamin D in her diet.   Past Medical History:  Diagnosis Date  . GERD (gastroesophageal reflux disease)   . Molar pregnancy   . Psoriasis   . Tobacco use     Past Surgical History:  Procedure Laterality Date  . DILATION AND CURETTAGE OF UTERUS     Molar Pregnancy  . TUBAL LIGATION  2013    Family History  Problem Relation Age of Onset  . Breast cancer Neg Hx   . Ovarian cancer Neg Hx     Social History   Socioeconomic History  . Marital status: Single    Spouse name: Not on file  . Number of children: 2  . Years of education: Not on file  . Highest education level: Not on file  Occupational History  . Not on file  Tobacco Use  . Smoking status: Current Every Day Smoker  . Smokeless tobacco: Never Used  Vaping Use  . Vaping Use: Never used    Substance and Sexual Activity  . Alcohol use: Yes  . Drug use: No  . Sexual activity: Yes    Birth control/protection: Surgical    Comment: Tubal ligation  Other Topics Concern  . Not on file  Social History Narrative  . Not on file   Social Determinants of Health   Financial Resource Strain:   . Difficulty of Paying Living Expenses: Not on file  Food Insecurity:   . Worried About Programme researcher, broadcasting/film/video in the Last Year: Not on file  . Ran Out of Food in the Last Year: Not on file  Transportation Needs:   . Lack of Transportation (Medical): Not on file  . Lack of Transportation (Non-Medical): Not on file  Physical Activity:   . Days of Exercise per Week: Not on file  . Minutes of Exercise per Session: Not on file  Stress:   . Feeling of Stress : Not on file  Social Connections:   . Frequency of Communication with Friends and Family: Not on file  . Frequency of Social Gatherings with Friends and Family: Not on file  . Attends Religious Services: Not on file  . Active Member of Clubs or Organizations: Not on file  . Attends Banker Meetings:  Not on file  . Marital Status: Not on file  Intimate Partner Violence:   . Fear of Current or Ex-Partner: Not on file  . Emotionally Abused: Not on file  . Physically Abused: Not on file  . Sexually Abused: Not on file     Current Outpatient Medications:  .  Adalimumab (HUMIRA PEN [place]), Inject into the skin. Every 2 weeks, 80 mg pen., Disp: , Rfl:  .  augmented betamethasone dipropionate (DIPROLENE-AF) 0.05 % cream, APPLY THIN FILM TO AFFECTED NON FACIAL SITES TWICE DAILY UNTIL CLEAR, Disp: , Rfl:  .  omeprazole (PRILOSEC) 40 MG capsule, , Disp: , Rfl:  .  triamcinolone (KENALOG) 0.025 % cream, APPLY TO AFFECTED AREA TWICE A DAY, Disp: , Rfl:  .  sertraline (ZOLOFT) 50 MG tablet, Take 1 tablet (50 mg total) by mouth daily., Disp: 30 tablet, Rfl: 5   ROS:  Review of Systems  Constitutional: Positive for fatigue.  Negative for fever and unexpected weight change.  Respiratory: Negative for cough, shortness of breath and wheezing.   Cardiovascular: Negative for chest pain, palpitations and leg swelling.  Gastrointestinal: Negative for blood in stool, constipation, diarrhea, nausea and vomiting.  Endocrine: Negative for cold intolerance, heat intolerance and polyuria.  Genitourinary: Negative for dyspareunia, dysuria, flank pain, frequency, genital sores, hematuria, menstrual problem, pelvic pain, urgency, vaginal bleeding, vaginal discharge and vaginal pain.  Musculoskeletal: Negative for back pain, joint swelling and myalgias.  Skin: Negative for rash.  Neurological: Negative for dizziness, syncope, light-headedness, numbness and headaches.  Hematological: Negative for adenopathy.  Psychiatric/Behavioral: Positive for agitation and dysphoric mood. Negative for confusion, sleep disturbance and suicidal ideas. The patient is not nervous/anxious.   BREAST: No symptoms   Objective: BP 110/80   Ht 5' (1.524 m)   Wt 151 lb (68.5 kg)   LMP 11/20/2019 (Exact Date)   BMI 29.49 kg/m    Physical Exam Constitutional:      Appearance: She is well-developed.  Genitourinary:     Vulva, vagina, cervix, uterus, right adnexa and left adnexa normal.     No vulval lesion or tenderness noted.     No vaginal discharge, erythema or tenderness.     No cervical polyp.     Uterus is not enlarged or tender.     No right or left adnexal mass present.     Right adnexa not tender.     Left adnexa not tender.  Neck:     Thyroid: No thyromegaly.  Cardiovascular:     Rate and Rhythm: Normal rate and regular rhythm.     Heart sounds: Normal heart sounds. No murmur heard.   Pulmonary:     Effort: Pulmonary effort is normal.     Breath sounds: Normal breath sounds.  Chest:     Breasts:        Right: No mass, nipple discharge, skin change or tenderness.        Left: No mass, nipple discharge, skin change or  tenderness.  Abdominal:     Palpations: Abdomen is soft.     Tenderness: There is no abdominal tenderness. There is no guarding.  Musculoskeletal:        General: Normal range of motion.     Cervical back: Normal range of motion.  Neurological:     General: No focal deficit present.     Mental Status: She is alert and oriented to person, place, and time.     Cranial Nerves: No cranial nerve deficit.  Skin:  General: Skin is warm and dry.  Psychiatric:        Mood and Affect: Mood normal.        Behavior: Behavior normal.        Thought Content: Thought content normal.        Judgment: Judgment normal.  Vitals reviewed.    Depression screen PHQ 2/9 12/01/2019  Decreased Interest 1  Down, Depressed, Hopeless 1  PHQ - 2 Score 2  Altered sleeping 1  Tired, decreased energy 1  Change in appetite 1  Feeling bad or failure about yourself  1  Trouble concentrating 1  Moving slowly or fidgety/restless 1  Suicidal thoughts 0  PHQ-9 Score 8  Difficult doing work/chores Somewhat difficult   GAD 7 : Generalized Anxiety Score 12/01/2019  Nervous, Anxious, on Edge 2  Control/stop worrying 2  Worry too much - different things 2  Trouble relaxing 2  Restless 2  Easily annoyed or irritable 2  Afraid - awful might happen 1  Total GAD 7 Score 13  Anxiety Difficulty Somewhat difficult   SX WITH PMDD; no sx if not luteal phase   Assessment/Plan: Encounter for annual routine gynecological examination  Screening for STD (sexually transmitted disease) - Plan: HIV Antibody (routine testing w rflx), RPR, HSV 2 antibody, IgG, Hepatitis C antibody, Cervicovaginal ancillary only  PMDD (premenstrual dysphoric disorder) - Plan: sertraline (ZOLOFT) 50 MG tablet; discussed SSRIs. Pt would like to try. Rx zoloft luteal phase, cont exercise. F/u prn.   Need for immunization against influenza - Plan: Flu Vaccine QUAD 36+ mos IM    Meds ordered this encounter  Medications  . sertraline  (ZOLOFT) 50 MG tablet    Sig: Take 1 tablet (50 mg total) by mouth daily.    Dispense:  30 tablet    Refill:  5    Order Specific Question:   Supervising Provider    Answer:   Nadara Mustard [[id number]]    GYN counsel adequate intake of calcium and vitamin D, diet and exercise, tobacco cessation     F/U  Return in about 1 year (around 11/30/2020).  Keyasia Jolliff B. Brennen Gardiner, PA-C 12/01/2019 4:41 PM

## 2019-12-01 NOTE — Patient Instructions (Signed)
I value your feedback and entrusting us with your care. If you get a [facility] patient survey, I would appreciate you taking the time to let us know about your experience today. Thank you!  As of January 08, 2019, your lab results will be released to your MyChart immediately, before I even have a chance to see them. Please give me time to review them and contact you if there are any abnormalities. Thank you for your patience.  

## 2019-12-02 LAB — HSV 2 ANTIBODY, IGG: HSV 2 IgG, Type Spec: <0.91 index (ref 0.00–0.90)

## 2019-12-02 LAB — HEPATITIS C ANTIBODY: Hep C Virus Ab: <0.1 s/co ratio (ref 0.0–0.9)

## 2019-12-02 LAB — HIV ANTIBODY (ROUTINE TESTING W REFLEX): HIV Screen 4th Generation wRfx: NONREACTIVE

## 2019-12-02 LAB — RPR: RPR Ser Ql: NONREACTIVE

## 2019-12-03 LAB — CERVICOVAGINAL ANCILLARY ONLY
Chlamydia: NEGATIVE
Comment: NEGATIVE
Comment: NEGATIVE
Comment: NORMAL
Neisseria Gonorrhea: NEGATIVE
Trichomonas: NEGATIVE

## 2019-12-16 DIAGNOSIS — S60222A Contusion of left hand, initial encounter: Secondary | ICD-10-CM | POA: Diagnosis not present

## 2019-12-28 ENCOUNTER — Other Ambulatory Visit: Payer: Self-pay | Admitting: Obstetrics and Gynecology

## 2019-12-28 DIAGNOSIS — F3281 Premenstrual dysphoric disorder: Secondary | ICD-10-CM

## 2020-01-08 DIAGNOSIS — Z79899 Other long term (current) drug therapy: Secondary | ICD-10-CM | POA: Diagnosis not present

## 2020-01-08 DIAGNOSIS — L400 Psoriasis vulgaris: Secondary | ICD-10-CM | POA: Diagnosis not present

## 2020-02-02 ENCOUNTER — Other Ambulatory Visit: Payer: Self-pay

## 2020-02-02 MED ORDER — OMEPRAZOLE 40 MG PO CPDR
40.0000 mg | DELAYED_RELEASE_CAPSULE | Freq: Every day | ORAL | 0 refills | Status: DC
Start: 2020-02-02 — End: 2020-04-15

## 2020-03-24 ENCOUNTER — Encounter: Payer: Self-pay | Admitting: Family Medicine

## 2020-03-24 ENCOUNTER — Ambulatory Visit (INDEPENDENT_AMBULATORY_CARE_PROVIDER_SITE_OTHER): Payer: Managed Care, Other (non HMO) | Admitting: Family Medicine

## 2020-03-24 VITALS — BP 128/85 | HR 74 | Temp 97.2°F | Ht 61.0 in | Wt 153.4 lb

## 2020-03-24 DIAGNOSIS — J0110 Acute frontal sinusitis, unspecified: Secondary | ICD-10-CM | POA: Diagnosis not present

## 2020-03-24 DIAGNOSIS — Z20822 Contact with and (suspected) exposure to covid-19: Secondary | ICD-10-CM

## 2020-03-24 LAB — POC COVID19 BINAXNOW: SARS Coronavirus 2 Ag: NEGATIVE

## 2020-03-24 MED ORDER — AZITHROMYCIN 250 MG PO TABS: ORAL_TABLET | ORAL | 0 refills | Status: DC

## 2020-03-24 NOTE — Progress Notes (Signed)
Established Patient Office Visit  SUBJECTIVE:  Subjective  Patient ID: Beth Bass, female    DOB: 15-Sep-1985  Age: 35 y.o. MRN: [id number]  CC:  Chief Complaint  Patient presents with  . URI    HPI Beth Bass is a 35 y.o. female presenting today for     Past Medical History:  Diagnosis Date  . GERD (gastroesophageal reflux disease)   . Molar pregnancy   . Psoriasis   . Tobacco use     Past Surgical History:  Procedure Laterality Date  . DILATION AND CURETTAGE OF UTERUS     Molar Pregnancy  . TUBAL LIGATION  2013    Family History  Problem Relation Age of Onset  . Breast cancer Neg Hx   . Ovarian cancer Neg Hx     Social History   Socioeconomic History  . Marital status: Single    Spouse name: Not on file  . Number of children: 2  . Years of education: Not on file  . Highest education level: Not on file  Occupational History  . Not on file  Tobacco Use  . Smoking status: Current Every Day Smoker  . Smokeless tobacco: Never Used  Vaping Use  . Vaping Use: Never used  Substance and Sexual Activity  . Alcohol use: Yes  . Drug use: No  . Sexual activity: Yes    Birth control/protection: Surgical    Comment: Tubal ligation  Other Topics Concern  . Not on file  Social History Narrative  . Not on file   Social Determinants of Health   Financial Resource Strain: Not on file  Food Insecurity: Not on file  Transportation Needs: Not on file  Physical Activity: Not on file  Stress: Not on file  Social Connections: Not on file  Intimate Partner Violence: Not on file     Current Outpatient Medications:  .  Adalimumab (HUMIRA PEN [place]), Inject into the skin. Every 2 weeks, 80 mg pen., Disp: , Rfl:  .  augmented betamethasone dipropionate (DIPROLENE-AF) 0.05 % cream, APPLY THIN FILM TO AFFECTED NON FACIAL SITES TWICE DAILY UNTIL CLEAR, Disp: , Rfl:  .  azithromycin (ZITHROMAX) 250 MG tablet, Take 2 tabs today then 1 tab per day for 4 days.,  Disp: 6 tablet, Rfl: 0 .  omeprazole (PRILOSEC) 40 MG capsule, Take 1 capsule (40 mg total) by mouth daily., Disp: 90 capsule, Rfl: 0 .  sertraline (ZOLOFT) 50 MG tablet, Take 1 tablet (50 mg total) by mouth daily., Disp: 30 tablet, Rfl: 5 .  triamcinolone (KENALOG) 0.025 % cream, APPLY TO AFFECTED AREA TWICE A DAY, Disp: , Rfl:    Allergies  Allergen Reactions  . Penicillins Hives    ROS Review of Systems  Constitutional: Positive for activity change and fever.  HENT: Positive for congestion, rhinorrhea, sinus pressure, sinus pain and sneezing.   Respiratory: Negative.   Cardiovascular: Negative.   Musculoskeletal: Negative.   Neurological: Negative.   Psychiatric/Behavioral: Negative.      OBJECTIVE:    Physical Exam Vitals and nursing note reviewed.  Constitutional:      Appearance: Normal appearance.  HENT:     Head: Normocephalic.     Nose: Nose normal.     Mouth/Throat:     Mouth: Mucous membranes are moist.  Eyes:     Pupils: Pupils are equal, round, and reactive to light.  Cardiovascular:     Rate and Rhythm: Normal rate and regular rhythm.  Pulmonary:  Effort: Pulmonary effort is normal.  Skin:    General: Skin is warm.  Neurological:     Mental Status: She is alert.  Psychiatric:        Mood and Affect: Mood normal.     BP 128/85   Pulse 74   Temp (!) 97.2 F (36.2 C) (Oral)   Ht 5' 1"  (1.549 m)   Wt 153 lb 6.4 oz (69.6 kg)   BMI 28.98 kg/m  Wt Readings from Last 3 Encounters:  03/24/20 153 lb 6.4 oz (69.6 kg)  12/01/19 151 lb (68.5 kg)  05/19/19 152 lb (68.9 kg)    Health Maintenance Due  Topic Date Due  . COVID-19 Vaccine (3 - Booster for Pfizer series) 10/14/2019  . PAP SMEAR-Modifier  03/22/2020    There are no preventive care reminders to display for this patient.  CBC Latest Ref Rng & Units 09/09/2011 09/07/2011 03/19/2011  WBC 3.6 - 11.0 x10 3/mm 3 - 10.8 9.1  Hemoglobin 12.0 - 16.0 g/dL - 10.9(L) 11.2(L)  Hematocrit 35.0 - 47.0  % 28.9(L) 31.8(L) 32.2(L)  Platelets 150 - 440 x10 3/mm 3 - 226 191   No flowsheet data found.  No results found for: TSH No results found for: ALBUMIN, ANIONGAP, EGFR, GFR No results found for: CHOL, HDL, LDLCALC, CHOLHDL No results found for: TRIG No results found for: HGBA1C    ASSESSMENT & PLAN:   Problem List Items Addressed This Visit      Respiratory   Acute non-recurrent frontal sinusitis    Acute sinus pressure and pain with low grade fever at home, eyes running. Plan- Sudafed, Flonase and Zithromax.       Relevant Medications   azithromycin (ZITHROMAX) 250 MG tablet    Other Visit Diagnoses    Suspected COVID-19 virus infection    -  Primary   Relevant Orders   POC COVID-19      Meds ordered this encounter  Medications  . azithromycin (ZITHROMAX) 250 MG tablet    Sig: Take 2 tabs today then 1 tab per day for 4 days.    Dispense:  6 tablet    Refill:  0      Follow-up: No follow-ups on file.    Beckie Salts, [place] [address] [place], [place] [address]

## 2020-03-24 NOTE — Assessment & Plan Note (Signed)
Acute sinus pressure and pain with low grade fever at home, eyes running. Plan- Sudafed, Flonase and Zithromax.

## 2020-03-31 MED ORDER — FLUCONAZOLE 150 MG PO TABS: 150.0000 mg | ORAL_TABLET | Freq: Every day | ORAL | 1 refills | Status: DC

## 2020-03-31 NOTE — Addendum Note (Signed)
Addended by: Jobie Quaker on: 03/31/2020 03:28 PM   Modules accepted: Orders

## 2020-04-15 ENCOUNTER — Other Ambulatory Visit: Payer: Self-pay | Admitting: *Deleted

## 2020-04-15 MED ORDER — OMEPRAZOLE 40 MG PO CPDR: 40.0000 mg | DELAYED_RELEASE_CAPSULE | Freq: Every day | ORAL | 3 refills | Status: DC

## 2020-06-15 ENCOUNTER — Other Ambulatory Visit: Payer: Self-pay | Admitting: Internal Medicine

## 2020-08-14 ENCOUNTER — Other Ambulatory Visit: Payer: Self-pay | Admitting: Internal Medicine

## 2020-12-12 ENCOUNTER — Other Ambulatory Visit: Payer: Self-pay

## 2020-12-12 ENCOUNTER — Encounter: Payer: Self-pay | Admitting: Obstetrics and Gynecology

## 2020-12-12 ENCOUNTER — Ambulatory Visit (INDEPENDENT_AMBULATORY_CARE_PROVIDER_SITE_OTHER): Payer: Managed Care, Other (non HMO) | Admitting: Obstetrics and Gynecology

## 2020-12-12 VITALS — BP 120/80 | Ht 60.0 in | Wt 153.0 lb

## 2020-12-12 DIAGNOSIS — N760 Acute vaginitis: Secondary | ICD-10-CM

## 2020-12-12 DIAGNOSIS — N926 Irregular menstruation, unspecified: Secondary | ICD-10-CM

## 2020-12-12 DIAGNOSIS — B9689 Other specified bacterial agents as the cause of diseases classified elsewhere: Secondary | ICD-10-CM | POA: Diagnosis not present

## 2020-12-12 DIAGNOSIS — Z01419 Encounter for gynecological examination (general) (routine) without abnormal findings: Secondary | ICD-10-CM

## 2020-12-12 LAB — POCT WET PREP WITH KOH
Clue Cells Wet Prep HPF POC: POSITIVE
KOH Prep POC: POSITIVE — AB
Trichomonas, UA: NEGATIVE
Yeast Wet Prep HPF POC: NEGATIVE

## 2020-12-12 LAB — POCT URINE PREGNANCY: Preg Test, Ur: NEGATIVE

## 2020-12-12 MED ORDER — FLUCONAZOLE 150 MG PO TABS
150.0000 mg | ORAL_TABLET | Freq: Once | ORAL | 0 refills | Status: AC
Start: 2020-12-12 — End: 2020-12-12

## 2020-12-12 MED ORDER — METRONIDAZOLE 500 MG PO TABS: 500.0000 mg | ORAL_TABLET | Freq: Two times a day (BID) | ORAL | 0 refills | Status: DC

## 2020-12-12 NOTE — Progress Notes (Signed)
PCP:  Corky Downs, MD   Chief Complaint  Patient presents with   Gynecologic Exam    Late a few days on cycle this month   vaginal odor    Had Fishy/sour odor, itchiness and irritation a few days ago, not anymore    HPI:      [name] is a 35 y.o. [id number] who LMP was Patient's last menstrual period was 11/05/2020 (exact date)., presents today for her annual examination.  Her menses are regular every 28-30 days, lasting 4-5 days.  Dysmenorrhea mild, occurring premenstrually. She does not have intermenstrual bleeding. Hx of PMDD sx luteal phase (see GAD/PHQ below); sx resolved now. Menses 1 wk late this cycle, not unusual for pt.  Sex activity: single partner, contraception - tubal ligation.  Last Pap: March 22, 2017  Results were: no abnormalities /neg HPV DNA  Hx of STDs: trich in past, neg TOC  Has had increased vag d/c with fishy odor/mild irritation, off and on recently. No hx of BV. Did have some discomfort with sex recently which is unusual for pt.  There is no FH of breast cancer. There is no FH of ovarian cancer. The patient does do self-breast exams.  Tobacco use: The patient currently smokes 1/4  packs of cigarettes per day  (was 1/2 ppd) for the past many years. Trying to quit. Alcohol use: none No drug use.  Exercise: moderately active  She does get adequate calcium but not Vitamin D in her diet.   Past Medical History:  Diagnosis Date   GERD (gastroesophageal reflux disease)    Molar pregnancy    Psoriasis    Tobacco use     Past Surgical History:  Procedure Laterality Date   DILATION AND CURETTAGE OF UTERUS     Molar Pregnancy   TUBAL LIGATION  2013    Family History  Problem Relation Age of Onset   Breast cancer Neg Hx    Ovarian cancer Neg Hx     Social History   Socioeconomic History   Marital status: Single    Spouse name: Not on file   Number of children: 2   Years of education: Not on file   Highest education level: Not  on file  Occupational History   Not on file  Tobacco Use   Smoking status: Every Day   Smokeless tobacco: Never  Vaping Use   Vaping Use: Never used  Substance and Sexual Activity   Alcohol use: Yes   Drug use: No   Sexual activity: Yes    Birth control/protection: Surgical    Comment: Tubal ligation  Other Topics Concern   Not on file  Social History Narrative   Not on file   Social Determinants of Health   Financial Resource Strain: Not on file  Food Insecurity: Not on file  Transportation Needs: Not on file  Physical Activity: Not on file  Stress: Not on file  Social Connections: Not on file  Intimate Partner Violence: Not on file     Current Outpatient Medications:    Adalimumab (HUMIRA PEN [place]), Inject into the skin. Every 2 weeks, 80 mg pen., Disp: , Rfl:    augmented betamethasone dipropionate (DIPROLENE-AF) 0.05 % cream, APPLY THIN FILM TO AFFECTED NON FACIAL SITES TWICE DAILY UNTIL CLEAR, Disp: , Rfl:    fluconazole (DIFLUCAN) 150 MG tablet, Take 1 tablet (150 mg total) by mouth once for 1 dose., Disp: 1 tablet, Rfl: 0   metroNIDAZOLE (FLAGYL) 500  MG tablet, Take 1 tablet (500 mg total) by mouth 2 (two) times daily for 7 days., Disp: 14 tablet, Rfl: 0   omeprazole (PRILOSEC) 40 MG capsule, TAKE 1 CAPSULE (40 MG TOTAL) BY MOUTH DAILY., Disp: 90 capsule, Rfl: 3   triamcinolone (KENALOG) 0.025 % cream, APPLY TO AFFECTED AREA TWICE A DAY, Disp: , Rfl:    triamcinolone ointment (KENALOG) 0.1 %, Apply topically 2 (two) times daily., Disp: , Rfl:    ROS:  Review of Systems  Constitutional:  Negative for fatigue, fever and unexpected weight change.  Respiratory:  Negative for cough, shortness of breath and wheezing.   Cardiovascular:  Negative for chest pain, palpitations and leg swelling.  Gastrointestinal:  Negative for blood in stool, constipation, diarrhea, nausea and vomiting.  Endocrine: Negative for cold intolerance, heat intolerance and polyuria.   Genitourinary:  Positive for dyspareunia and vaginal discharge. Negative for dysuria, flank pain, frequency, genital sores, hematuria, menstrual problem, pelvic pain, urgency, vaginal bleeding and vaginal pain.  Musculoskeletal:  Negative for back pain, joint swelling and myalgias.  Skin:  Negative for rash.  Neurological:  Negative for dizziness, syncope, light-headedness, numbness and headaches.  Hematological:  Negative for adenopathy.  Psychiatric/Behavioral:  Negative for agitation, confusion, dysphoric mood, sleep disturbance and suicidal ideas. The patient is not nervous/anxious.  BREAST: No symptoms   Objective: BP 120/80   Ht 5' (1.524 m)   Wt 153 lb (69.4 kg)   LMP 11/05/2020 (Exact Date)   BMI 29.88 kg/m    Physical Exam Constitutional:      Appearance: She is well-developed.  Genitourinary:     Vulva normal.     Right Labia: No rash, tenderness or lesions.    Left Labia: No tenderness, lesions or rash.    No vaginal discharge, erythema or tenderness.      Right Adnexa: not tender and no mass present.    Left Adnexa: not tender and no mass present.    No cervical friability or polyp.     Uterus is not enlarged or tender.  Breasts:    Right: No mass, nipple discharge, skin change or tenderness.     Left: No mass, nipple discharge, skin change or tenderness.  Neck:     Thyroid: No thyromegaly.  Cardiovascular:     Rate and Rhythm: Normal rate and regular rhythm.     Heart sounds: Normal heart sounds. No murmur heard. Pulmonary:     Effort: Pulmonary effort is normal.     Breath sounds: Normal breath sounds.  Abdominal:     Palpations: Abdomen is soft.     Tenderness: There is no abdominal tenderness. There is no guarding or rebound.  Musculoskeletal:        General: Normal range of motion.     Cervical back: Normal range of motion.  Lymphadenopathy:     Cervical: No cervical adenopathy.  Neurological:     General: No focal deficit present.     Mental  Status: She is alert and oriented to person, place, and time.     Cranial Nerves: No cranial nerve deficit.  Skin:    General: Skin is warm and dry.  Psychiatric:        Mood and Affect: Mood normal.        Behavior: Behavior normal.        Thought Content: Thought content normal.        Judgment: Judgment normal.  Vitals reviewed.   Results for orders placed or performed in  visit on 12/12/20 (from the past 24 hour(s))  POCT urine pregnancy     Status: Normal   Collection Time: 12/12/20  2:01 PM  Result Value Ref Range   Preg Test, Ur Negative Negative  POCT Wet Prep with KOH     Status: Abnormal   Collection Time: 12/12/20  2:01 PM  Result Value Ref Range   Trichomonas, UA Negative    Clue Cells Wet Prep HPF POC pos    Epithelial Wet Prep HPF POC     Yeast Wet Prep HPF POC neg    Bacteria Wet Prep HPF POC     RBC Wet Prep HPF POC     WBC Wet Prep HPF POC     KOH Prep POC Positive (A) Negative    Assessment/Plan: Encounter for annual routine gynecological examination  Late menses - Plan: POCT urine pregnancy, neg UPT. Reassurance. Most likely related to stress.  BV (bacterial vaginosis) - Plan: POCT Wet Prep with KOH, metroNIDAZOLE (FLAGYL) 500 MG tablet, fluconazole (DIFLUCAN) 150 MG tablet; pos sx and wet prep. Rx flagyl, no EtOH. Rx diflucan prn yeast vag after flagyl use. Will RF if sx recur. F/u prn.    Meds ordered this encounter  Medications   metroNIDAZOLE (FLAGYL) 500 MG tablet    Sig: Take 1 tablet (500 mg total) by mouth 2 (two) times daily for 7 days.    Dispense:  14 tablet    Refill:  0    Order Specific Question:   Supervising Provider    Answer:   Gae Dry [id number]   fluconazole (DIFLUCAN) 150 MG tablet    Sig: Take 1 tablet (150 mg total) by mouth once for 1 dose.    Dispense:  1 tablet    Refill:  0    Order Specific Question:   Supervising Provider    Answer:   Gae Dry [id number]     GYN counsel adequate intake of calcium and  vitamin D, diet and exercise, tobacco cessation     F/U  Return in about 1 year (around 12/12/2021).  Beth Pasquini B. Maydelin Deming, PA-C 12/12/2020 2:01 PM

## 2020-12-12 NOTE — Patient Instructions (Signed)
I value your feedback and you entrusting us with your care. If you get a [facility] patient survey, I would appreciate you taking the time to let us know about your experience today. Thank you! ? ? ?

## 2020-12-25 ENCOUNTER — Other Ambulatory Visit: Payer: Self-pay | Admitting: Obstetrics and Gynecology

## 2020-12-25 DIAGNOSIS — B9689 Other specified bacterial agents as the cause of diseases classified elsewhere: Secondary | ICD-10-CM

## 2020-12-26 ENCOUNTER — Other Ambulatory Visit: Payer: Self-pay | Admitting: Obstetrics and Gynecology

## 2020-12-26 ENCOUNTER — Telehealth: Payer: Self-pay

## 2020-12-26 DIAGNOSIS — N760 Acute vaginitis: Secondary | ICD-10-CM

## 2020-12-26 MED ORDER — METRONIDAZOLE 500 MG PO TABS
500.0000 mg | ORAL_TABLET | Freq: Two times a day (BID) | ORAL | 0 refills | Status: AC
Start: 2020-12-26 — End: 2021-01-02

## 2020-12-26 NOTE — Telephone Encounter (Signed)
Spoke w/patient. She completed the metronidazole. She had sex about a week ago and her symptoms (discharge w/fishy odor) has returned.

## 2020-12-26 NOTE — Telephone Encounter (Signed)
Pt aware.

## 2020-12-26 NOTE — Telephone Encounter (Signed)
Rx RF flagyl for recurrent BV sx. Use condoms for a month or so to prevent recurrence since sex is trigger for many women. F/u prn.

## 2020-12-26 NOTE — Telephone Encounter (Signed)
Patient inquiring about refill request. [phone]

## 2021-03-10 ENCOUNTER — Other Ambulatory Visit: Payer: Self-pay | Admitting: *Deleted

## 2021-03-10 MED ORDER — OMEPRAZOLE 40 MG PO CPDR: 40.0000 mg | DELAYED_RELEASE_CAPSULE | Freq: Every day | ORAL | 0 refills | Status: DC

## 2021-08-31 ENCOUNTER — Other Ambulatory Visit: Payer: Self-pay | Admitting: Internal Medicine

## 2021-10-31 ENCOUNTER — Other Ambulatory Visit: Payer: Self-pay | Admitting: Internal Medicine

## 2021-12-05 ENCOUNTER — Other Ambulatory Visit: Payer: Self-pay | Admitting: Otolaryngology

## 2021-12-05 DIAGNOSIS — R42 Dizziness and giddiness: Secondary | ICD-10-CM

## 2022-01-04 ENCOUNTER — Ambulatory Visit
Admission: RE | Admit: 2022-01-04 | Discharge: 2022-01-04 | Disposition: A | Discharge status: Home / Self Care | Payer: Managed Care, Other (non HMO) | Source: Ambulatory Visit | Attending: Otolaryngology | Admitting: Otolaryngology

## 2022-01-04 DIAGNOSIS — R42 Dizziness and giddiness: Secondary | ICD-10-CM

## 2022-01-04 MED ORDER — GADOBENATE DIMEGLUMINE 529 MG/ML IV SOLN
14.0000 mL | Freq: Once | INTRAVENOUS | Status: AC | PRN
  Administered 2022-01-04: 14 mL via INTRAVENOUS

## 2022-02-28 NOTE — Progress Notes (Unsigned)
PCP:  Cletis Athens, MD   No chief complaint on file.   HPI:      [name] is a 37 y.o. [id number] who LMP was No LMP recorded., presents today for her annual examination.  Her menses are regular every 28-30 days, lasting 4-5 days.  Dysmenorrhea mild, occurring premenstrually. She does not have intermenstrual bleeding. Hx of PMDD sx luteal phase (see GAD/PHQ below); sx resolved now. Menses 1 wk late this cycle, not unusual for pt.  Sex activity: single partner, contraception - tubal ligation.  Last Pap: March 22, 2017  Results were: no abnormalities /neg HPV DNA  Hx of STDs: trich in past, neg TOC  Has had increased vag d/c with fishy odor/mild irritation, off and on recently. No hx of BV. Did have some discomfort with sex recently which is unusual for pt.  There is no FH of breast cancer. There is no FH of ovarian cancer. The patient does do self-breast exams.  Tobacco use: The patient currently smokes 1/4  packs of cigarettes per day  (was 1/2 ppd) for the past many years. Trying to quit. Alcohol use: none No drug use.  Exercise: moderately active  She does get adequate calcium but not Vitamin D in her diet.   Past Medical History:  Diagnosis Date   GERD (gastroesophageal reflux disease)    Molar pregnancy    Psoriasis    Tobacco use     Past Surgical History:  Procedure Laterality Date   DILATION AND CURETTAGE OF UTERUS     Molar Pregnancy   TUBAL LIGATION  2013    Family History  Problem Relation Age of Onset   Breast cancer Neg Hx    Ovarian cancer Neg Hx     Social History   Socioeconomic History   Marital status: Single    Spouse name: Not on file   Number of children: 2   Years of education: Not on file   Highest education level: Not on file  Occupational History   Not on file  Tobacco Use   Smoking status: Every Day   Smokeless tobacco: Never  Vaping Use   Vaping Use: Never used  Substance and Sexual Activity   Alcohol use: Yes    Drug use: No   Sexual activity: Yes    Birth control/protection: Surgical    Comment: Tubal ligation  Other Topics Concern   Not on file  Social History Narrative   Not on file   Social Determinants of Health   Financial Resource Strain: Not on file  Food Insecurity: Not on file  Transportation Needs: Not on file  Physical Activity: Not on file  Stress: Not on file  Social Connections: Not on file  Intimate Partner Violence: Not on file     Current Outpatient Medications:    Adalimumab (HUMIRA PEN [place]), Inject into the skin. Every 2 weeks, 80 mg pen., Disp: , Rfl:    augmented betamethasone dipropionate (DIPROLENE-AF) 0.05 % cream, APPLY THIN FILM TO AFFECTED NON FACIAL SITES TWICE DAILY UNTIL CLEAR, Disp: , Rfl:    omeprazole (PRILOSEC) 40 MG capsule, Take 1 capsule (40 mg total) by mouth daily., Disp: 90 capsule, Rfl: 0   triamcinolone (KENALOG) 0.025 % cream, APPLY TO AFFECTED AREA TWICE A DAY, Disp: , Rfl:    triamcinolone ointment (KENALOG) 0.1 %, Apply topically 2 (two) times daily., Disp: , Rfl:    ROS:  Review of Systems  Constitutional:  Negative for fatigue, fever  and unexpected weight change.  Respiratory:  Negative for cough, shortness of breath and wheezing.   Cardiovascular:  Negative for chest pain, palpitations and leg swelling.  Gastrointestinal:  Negative for blood in stool, constipation, diarrhea, nausea and vomiting.  Endocrine: Negative for cold intolerance, heat intolerance and polyuria.  Genitourinary:  Positive for dyspareunia and vaginal discharge. Negative for dysuria, flank pain, frequency, genital sores, hematuria, menstrual problem, pelvic pain, urgency, vaginal bleeding and vaginal pain.  Musculoskeletal:  Negative for back pain, joint swelling and myalgias.  Skin:  Negative for rash.  Neurological:  Negative for dizziness, syncope, light-headedness, numbness and headaches.  Hematological:  Negative for adenopathy.  Psychiatric/Behavioral:   Negative for agitation, confusion, dysphoric mood, sleep disturbance and suicidal ideas. The patient is not nervous/anxious.   BREAST: No symptoms   Objective: There were no vitals taken for this visit.   Physical Exam Constitutional:      Appearance: She is well-developed.  Genitourinary:     Vulva normal.     Right Labia: No rash, tenderness or lesions.    Left Labia: No tenderness, lesions or rash.    No vaginal discharge, erythema or tenderness.      Right Adnexa: not tender and no mass present.    Left Adnexa: not tender and no mass present.    No cervical friability or polyp.     Uterus is not enlarged or tender.  Breasts:    Right: No mass, nipple discharge, skin change or tenderness.     Left: No mass, nipple discharge, skin change or tenderness.  Neck:     Thyroid: No thyromegaly.  Cardiovascular:     Rate and Rhythm: Normal rate and regular rhythm.     Heart sounds: Normal heart sounds. No murmur heard. Pulmonary:     Effort: Pulmonary effort is normal.     Breath sounds: Normal breath sounds.  Abdominal:     Palpations: Abdomen is soft.     Tenderness: There is no abdominal tenderness. There is no guarding or rebound.  Musculoskeletal:        General: Normal range of motion.     Cervical back: Normal range of motion.  Lymphadenopathy:     Cervical: No cervical adenopathy.  Neurological:     General: No focal deficit present.     Mental Status: She is alert and oriented to person, place, and time.     Cranial Nerves: No cranial nerve deficit.  Skin:    General: Skin is warm and dry.  Psychiatric:        Mood and Affect: Mood normal.        Behavior: Behavior normal.        Thought Content: Thought content normal.        Judgment: Judgment normal.  Vitals reviewed.    No results found for this or any previous visit (from the past 24 hour(s)).   Assessment/Plan: Encounter for annual routine gynecological examination  Late menses - Plan: POCT urine  pregnancy, neg UPT. Reassurance. Most likely related to stress.  BV (bacterial vaginosis) - Plan: POCT Wet Prep with KOH, metroNIDAZOLE (FLAGYL) 500 MG tablet, fluconazole (DIFLUCAN) 150 MG tablet; pos sx and wet prep. Rx flagyl, no EtOH. Rx diflucan prn yeast vag after flagyl use. Will RF if sx recur. F/u prn.    No orders of the defined types were placed in this encounter.    GYN counsel adequate intake of calcium and vitamin D, diet and exercise, tobacco cessation  F/U  No follow-ups on file.  Andrew Blasius B. Meko Masterson, PA-C 02/28/2022 8:57 PM

## 2022-03-01 ENCOUNTER — Other Ambulatory Visit (HOSPITAL_COMMUNITY)
Admission: RE | Admit: 2022-03-01 | Discharge: 2022-03-01 | Disposition: A | Discharge status: Home / Self Care | Payer: BC Managed Care – PPO | Source: Ambulatory Visit | Attending: Obstetrics and Gynecology | Admitting: Obstetrics and Gynecology

## 2022-03-01 ENCOUNTER — Encounter: Payer: Self-pay | Admitting: Obstetrics and Gynecology

## 2022-03-01 ENCOUNTER — Ambulatory Visit (INDEPENDENT_AMBULATORY_CARE_PROVIDER_SITE_OTHER): Payer: BC Managed Care – PPO | Admitting: Obstetrics and Gynecology

## 2022-03-01 VITALS — BP 110/70 | Ht 60.0 in | Wt 162.0 lb

## 2022-03-01 DIAGNOSIS — Z1151 Encounter for screening for human papillomavirus (HPV): Secondary | ICD-10-CM

## 2022-03-01 DIAGNOSIS — Z01411 Encounter for gynecological examination (general) (routine) with abnormal findings: Secondary | ICD-10-CM

## 2022-03-01 DIAGNOSIS — Z124 Encounter for screening for malignant neoplasm of cervix: Secondary | ICD-10-CM | POA: Insufficient documentation

## 2022-03-01 DIAGNOSIS — Z113 Encounter for screening for infections with a predominantly sexual mode of transmission: Secondary | ICD-10-CM

## 2022-03-01 DIAGNOSIS — N760 Acute vaginitis: Secondary | ICD-10-CM

## 2022-03-01 DIAGNOSIS — Z01419 Encounter for gynecological examination (general) (routine) without abnormal findings: Secondary | ICD-10-CM

## 2022-03-01 DIAGNOSIS — B9689 Other specified bacterial agents as the cause of diseases classified elsewhere: Secondary | ICD-10-CM

## 2022-03-01 DIAGNOSIS — Z23 Encounter for immunization: Secondary | ICD-10-CM

## 2022-03-01 MED ORDER — METRONIDAZOLE 500 MG PO TABS: ORAL_TABLET | ORAL | 0 refills | Status: DC

## 2022-03-01 NOTE — Patient Instructions (Signed)
I value your feedback and you entrusting us with your care. If you get a [facility] patient survey, I would appreciate you taking the time to let us know about your experience today. Thank you! ? ? ?

## 2022-03-07 LAB — CYTOLOGY - PAP
Chlamydia: NEGATIVE
Comment: NEGATIVE
Comment: NEGATIVE
Comment: NORMAL
Diagnosis: NEGATIVE
High risk HPV: NEGATIVE
Neisseria Gonorrhea: NEGATIVE

## 2022-04-02 ENCOUNTER — Other Ambulatory Visit: Payer: Self-pay

## 2022-04-02 ENCOUNTER — Telehealth: Payer: Self-pay

## 2022-04-02 DIAGNOSIS — B3731 Acute candidiasis of vulva and vagina: Secondary | ICD-10-CM

## 2022-04-02 MED ORDER — FLUCONAZOLE 150 MG PO TABS: 150.0000 mg | ORAL_TABLET | Freq: Once | ORAL | 0 refills | Status: AC

## 2022-04-02 NOTE — Telephone Encounter (Signed)
Pt called triage reporting she has used 2 rounds of monistat and the yeast has not cleared up wanted a diflucan sent in. Rx sent in pt aware. Left voice message to advise of which pharmacy medicine was sent too.

## 2022-05-10 ENCOUNTER — Ambulatory Visit: Payer: BC Managed Care – PPO | Admitting: Nurse Practitioner

## 2022-05-10 VITALS — BP 112/84 | HR 57 | Ht 60.0 in | Wt 156.8 lb

## 2022-05-10 DIAGNOSIS — L409 Psoriasis, unspecified: Secondary | ICD-10-CM | POA: Diagnosis not present

## 2022-05-10 DIAGNOSIS — F419 Anxiety disorder, unspecified: Secondary | ICD-10-CM

## 2022-05-10 DIAGNOSIS — K219 Gastro-esophageal reflux disease without esophagitis: Secondary | ICD-10-CM

## 2022-05-10 NOTE — Progress Notes (Signed)
Established Patient Office Visit  Subjective:  Patient ID: Beth Bass, female    DOB: March 24, 1985  Age: 37 y.o. MRN: [id number]  Chief Complaint  Patient presents with   Follow-up    4 month follow up, review lab results.    4 month follow up and lab results, looking for those results because Neurology and GYN are available but last labs from this provider are in October.      No other concerns at this time.   Past Medical History:  Diagnosis Date   GERD (gastroesophageal reflux disease)    Molar pregnancy    Psoriasis    Tobacco use     Past Surgical History:  Procedure Laterality Date   DILATION AND CURETTAGE OF UTERUS     Molar Pregnancy   TUBAL LIGATION  2013    Social History   Socioeconomic History   Marital status: Single    Spouse name: Not on file   Number of children: 2   Years of education: Not on file   Highest education level: Not on file  Occupational History   Not on file  Tobacco Use   Smoking status: Former    Types: Cigarettes   Smokeless tobacco: Never  Vaping Use   Vaping Use: Never used  Substance and Sexual Activity   Alcohol use: Yes   Drug use: No   Sexual activity: Yes    Birth control/protection: Surgical    Comment: Tubal ligation  Other Topics Concern   Not on file  Social History Narrative   Not on file   Social Determinants of Health   Financial Resource Strain: Not on file  Food Insecurity: Not on file  Transportation Needs: Not on file  Physical Activity: Not on file  Stress: Not on file  Social Connections: Not on file  Intimate Partner Violence: Not on file    Family History  Problem Relation Age of Onset   Breast cancer Neg Hx    Ovarian cancer Neg Hx     Allergies  Allergen Reactions   Penicillins Hives and Other (See Comments)    Review of Systems  Constitutional: Negative.   HENT: Negative.    Eyes: Negative.   Respiratory: Negative.    Cardiovascular: Negative.   Gastrointestinal:  Negative.   Genitourinary: Negative.   Musculoskeletal: Negative.   Skin: Negative.   Neurological: Negative.   Endo/Heme/Allergies: Negative.   Psychiatric/Behavioral: Negative.         Objective:   BP 112/84   Pulse (!) 57   Ht 5' (1.524 m)   Wt 156 lb 12.8 oz (71.1 kg)   SpO2 100%   BMI 30.62 kg/m   Vitals:   05/10/22 1013  BP: 112/84  Pulse: (!) 57  Height: 5' (1.524 m)  Weight: 156 lb 12.8 oz (71.1 kg)  SpO2: 100%  BMI (Calculated): 30.62    Physical Exam Vitals reviewed.  Constitutional:      Appearance: Normal appearance.  HENT:     Head: Normocephalic.     Nose: Nose normal.     Mouth/Throat:     Mouth: Mucous membranes are moist.  Eyes:     Pupils: Pupils are equal, round, and reactive to light.  Cardiovascular:     Rate and Rhythm: Normal rate and regular rhythm.  Pulmonary:     Effort: Pulmonary effort is normal.     Breath sounds: Normal breath sounds.  Abdominal:     General: Bowel sounds are normal.  Palpations: Abdomen is soft.  Musculoskeletal:        General: Normal range of motion.     Cervical back: Normal range of motion and neck supple.  Skin:    General: Skin is warm and dry.  Neurological:     Mental Status: She is alert and oriented to person, place, and time.  Psychiatric:        Mood and Affect: Mood normal.        Behavior: Behavior normal.      No results found for any visits on 05/10/22.      Assessment & Plan:   Problem List Items Addressed This Visit       Digestive   GERD (gastroesophageal reflux disease) - Primary     Musculoskeletal and Integument   Psoriasis     Other   Anxiety   Relevant Medications   busPIRone (BUSPAR) 5 MG tablet    Return in about 4 months (around 09/09/2022).   Total time spent: 35 minutes  Orson Eva, NP  05/10/2022

## 2022-05-10 NOTE — Patient Instructions (Signed)
1) Did not have labs done so will return for fasting labs in the next 2 weeks 2) Last anxiety attack in November 3) Follow up appt in 4 months, fasting labs prior

## 2022-06-21 ENCOUNTER — Other Ambulatory Visit: Payer: Self-pay | Admitting: Nurse Practitioner

## 2022-06-21 ENCOUNTER — Telehealth: Payer: Self-pay

## 2022-06-21 DIAGNOSIS — F32A Depression, unspecified: Secondary | ICD-10-CM

## 2022-06-21 MED ORDER — BUPROPION HCL ER (XL) 150 MG PO TB24
150.0000 mg | ORAL_TABLET | ORAL | 5 refills | Status: DC
Start: 2022-06-21 — End: 2022-07-16

## 2022-06-21 NOTE — Telephone Encounter (Signed)
Patient called stating she had sent a message about a week or so ago in regards to starting and antidepressant daily if she wnted to and she has decided she does, can you send something in for her or does she need to make an appt?

## 2022-06-21 NOTE — Telephone Encounter (Signed)
LM for pt to call back.

## 2022-06-21 NOTE — Telephone Encounter (Signed)
Patient informed. 

## 2022-07-13 ENCOUNTER — Other Ambulatory Visit: Payer: Self-pay | Admitting: Nurse Practitioner

## 2022-07-13 DIAGNOSIS — F32A Depression, unspecified: Secondary | ICD-10-CM

## 2022-07-20 ENCOUNTER — Encounter: Payer: Self-pay | Admitting: Internal Medicine

## 2022-07-20 ENCOUNTER — Ambulatory Visit: Payer: BC Managed Care – PPO | Admitting: Internal Medicine

## 2022-07-20 VITALS — BP 120/79 | HR 60 | Ht 60.0 in | Wt 155.4 lb

## 2022-07-20 DIAGNOSIS — F419 Anxiety disorder, unspecified: Secondary | ICD-10-CM | POA: Diagnosis not present

## 2022-07-20 DIAGNOSIS — F32A Depression, unspecified: Secondary | ICD-10-CM | POA: Diagnosis not present

## 2022-07-20 DIAGNOSIS — K219 Gastro-esophageal reflux disease without esophagitis: Secondary | ICD-10-CM

## 2022-07-20 NOTE — Progress Notes (Signed)
Established Patient Office Visit  Subjective:  Patient ID: Beth Bass, female    DOB: October 06, 1985  Age: 37 y.o. MRN: [id number]  Chief Complaint  Patient presents with   Follow-up    F/U new meds    Patient in office for 1 month follow up since starting Wellbutrin. Reports she is not sleeping well.  Depression controlled on current dose of Wellbutrin. However feels an increase in anxiety.Only takes buspar 10 mg once a day and a second dose as needed.  Advised to take it twice a day or even tid if needed. Patient reports she is in therapy.    No other concerns at this time.   Past Medical History:  Diagnosis Date   GERD (gastroesophageal reflux disease)    Molar pregnancy    Psoriasis    Tobacco use     Past Surgical History:  Procedure Laterality Date   DILATION AND CURETTAGE OF UTERUS     Molar Pregnancy   TUBAL LIGATION  2013    Social History   Socioeconomic History   Marital status: Single    Spouse name: Not on file   Number of children: 2   Years of education: Not on file   Highest education level: Not on file  Occupational History   Not on file  Tobacco Use   Smoking status: Former    Types: Cigarettes   Smokeless tobacco: Never  Vaping Use   Vaping Use: Never used  Substance and Sexual Activity   Alcohol use: Yes   Drug use: No   Sexual activity: Yes    Birth control/protection: Surgical    Comment: Tubal ligation  Other Topics Concern   Not on file  Social History Narrative   Not on file   Social Determinants of Health   Financial Resource Strain: Not on file  Food Insecurity: Not on file  Transportation Needs: Not on file  Physical Activity: Not on file  Stress: Not on file  Social Connections: Not on file  Intimate Partner Violence: Not on file    Family History  Problem Relation Age of Onset   Breast cancer Neg Hx    Ovarian cancer Neg Hx     Allergies  Allergen Reactions   Penicillins Hives and Other (See Comments)     Review of Systems  Constitutional: Negative.  Negative for chills, diaphoresis, fever and weight loss.  HENT: Negative.    Eyes: Negative.   Respiratory: Negative.  Negative for cough and shortness of breath.   Cardiovascular: Negative.  Negative for chest pain and palpitations.  Gastrointestinal: Negative.  Negative for abdominal pain, constipation, diarrhea, heartburn, nausea and vomiting.  Genitourinary: Negative.  Negative for dysuria.  Musculoskeletal:  Positive for falls and joint pain. Negative for back pain, myalgias and neck pain.  Skin: Negative.   Neurological: Negative.  Negative for dizziness and headaches.  Endo/Heme/Allergies: Negative.   Psychiatric/Behavioral:  Positive for depression. Negative for substance abuse and suicidal ideas. The patient is nervous/anxious.        Objective:   BP 120/79   Pulse 60   Ht 5' (1.524 m)   Wt 155 lb 6.4 oz (70.5 kg)   SpO2 98%   BMI 30.35 kg/m   Vitals:   07/20/22 1319  BP: 120/79  Pulse: 60  Height: 5' (1.524 m)  Weight: 155 lb 6.4 oz (70.5 kg)  SpO2: 98%  BMI (Calculated): 30.35    Physical Exam Vitals and nursing note reviewed.  Constitutional:  Appearance: Normal appearance.  HENT:     Head: Normocephalic and atraumatic.     Nose: Nose normal.  Cardiovascular:     Rate and Rhythm: Normal rate and regular rhythm.     Heart sounds: Normal heart sounds.  Pulmonary:     Effort: Pulmonary effort is normal.     Breath sounds: Normal breath sounds.  Abdominal:     General: There is no distension.     Palpations: Abdomen is soft. There is no mass.     Tenderness: There is no right CVA tenderness or left CVA tenderness.  Musculoskeletal:        General: Normal range of motion.     Cervical back: Normal range of motion.  Skin:    General: Skin is warm and dry.  Neurological:     General: No focal deficit present.     Mental Status: She is alert and oriented to person, place, and time.  Psychiatric:         Mood and Affect: Mood normal.        Behavior: Behavior normal.      No results found for any visits on 07/20/22.  No results found for this or any previous visit (from the past 2160 hour(s)).    Assessment & Plan:  Patient taking and tolerating Wellbutrin. Reports her depression is well controlled. Anxiety is not controlled. Had a panic attack on 07/04/22 resulting in her calling EMS. Tests were all normal. Recommend taking Buspar 10 mg twice daily. Patient currently seeing a therapist, psychiatry available. Recommend seeing psychiatry  Problem List Items Addressed This Visit     GERD (gastroesophageal reflux disease)   Anxiety - Primary   Depression    Return in about 2 months (around 09/19/2022) for keep August appointment.   Total time spent: 25 minutes  Margaretann Loveless, MD  07/20/2022   This document may have been prepared by [facility] Voice Recognition software and as such may include unintentional dictation errors.

## 2022-09-01 ENCOUNTER — Other Ambulatory Visit: Payer: Self-pay | Admitting: Nurse Practitioner

## 2022-09-13 ENCOUNTER — Ambulatory Visit: Payer: BC Managed Care – PPO | Admitting: Cardiology

## 2022-11-08 ENCOUNTER — Other Ambulatory Visit: Payer: Self-pay

## 2022-11-09 MED ORDER — OMEPRAZOLE 40 MG PO CPDR: 40.0000 mg | DELAYED_RELEASE_CAPSULE | Freq: Every day | ORAL | 0 refills | Status: DC

## 2023-02-09 ENCOUNTER — Other Ambulatory Visit: Payer: Self-pay | Admitting: Cardiology

## 2023-05-12 ENCOUNTER — Other Ambulatory Visit: Payer: Self-pay | Admitting: Cardiology

## 2023-08-10 ENCOUNTER — Other Ambulatory Visit: Payer: Self-pay | Admitting: Cardiology

## 2023-09-24 NOTE — Progress Notes (Deleted)
 PCP:  Britta King, MD   No chief complaint on file.   HPI:      [name] is a 38 y.o. [id number] who LMP was No LMP recorded., presents today for her annual examination.  Her menses are regular every 28-30 days, lasting 3-5 days, mod flow, sometimes with clots.  Dysmenorrhea mild.  She does not have intermenstrual bleeding. Hx of PMDD sx luteal phase in past.   Sex activity: single partner, contraception - tubal ligation. No pain/bleeding. Wants STD testing. Has new partner.  Last Pap: 03/01/22 Results were: no abnormalities /neg HPV DNA  Hx of STDs: trich in past, neg TOC  Pt with hx of BV. Sx off and on since 2022. Has had some recently, usually triggered by sex. Sx come and go, has sx today. Uses condoms sometimes, hasn't tried boric acid supp.  There is no FH of breast cancer. There is no FH of ovarian cancer. The patient does do self-breast exams.  Tobacco use: QUIT 4/23 Alcohol use: none No drug use.  Exercise: moderately active  She does get adequate calcium but not Vitamin D in her diet.   Past Medical History:  Diagnosis Date   GERD (gastroesophageal reflux disease)    Molar pregnancy    Psoriasis    Tobacco use     Past Surgical History:  Procedure Laterality Date   DILATION AND CURETTAGE OF UTERUS     Molar Pregnancy   TUBAL LIGATION  2013    Family History  Problem Relation Age of Onset   Breast cancer Neg Hx    Ovarian cancer Neg Hx     Social History   Socioeconomic History   Marital status: Single    Spouse name: Not on file   Number of children: 2   Years of education: Not on file   Highest education level: Not on file  Occupational History   Not on file  Tobacco Use   Smoking status: Former    Types: Cigarettes   Smokeless tobacco: Never  Vaping Use   Vaping status: Never Used  Substance and Sexual Activity   Alcohol use: Yes   Drug use: No   Sexual activity: Yes    Birth control/protection: Surgical    Comment: Tubal  ligation  Other Topics Concern   Not on file  Social History Narrative   Not on file   Social Drivers of Health   Financial Resource Strain: Not on file  Food Insecurity: Not on file  Transportation Needs: Not on file  Physical Activity: Not on file  Stress: Not on file  Social Connections: Not on file  Intimate Partner Violence: Not on file     Current Outpatient Medications:    buPROPion  (WELLBUTRIN  XL) 150 MG 24 hr tablet, TAKE 1 TABLET BY MOUTH EVERY DAY IN THE MORNING, Disp: 90 tablet, Rfl: 2   busPIRone (BUSPAR) 5 MG tablet, TAKE 1 TABLET BY MOUTH TWICE DAILY AS NEEDED FOR MOMENTS OF HIGH ANXIETY, Disp: 180 tablet, Rfl: 1   omeprazole  (PRILOSEC) 40 MG capsule, TAKE 1 CAPSULE (40 MG TOTAL) BY MOUTH DAILY., Disp: 90 capsule, Rfl: 0   triamcinolone cream (KENALOG) 0.1 %, Apply topically., Disp: , Rfl:    ROS:  Review of Systems  Constitutional:  Negative for fatigue, fever and unexpected weight change.  Respiratory:  Negative for cough, shortness of breath and wheezing.   Cardiovascular:  Negative for chest pain, palpitations and leg swelling.  Gastrointestinal:  Negative for  blood in stool, constipation, diarrhea, nausea and vomiting.  Endocrine: Negative for cold intolerance, heat intolerance and polyuria.  Genitourinary:  Positive for vaginal discharge. Negative for dyspareunia, dysuria, flank pain, frequency, genital sores, hematuria, menstrual problem, pelvic pain, urgency, vaginal bleeding and vaginal pain.  Musculoskeletal:  Negative for back pain, joint swelling and myalgias.  Skin:  Negative for rash.  Neurological:  Negative for dizziness, syncope, light-headedness, numbness and headaches.  Hematological:  Negative for adenopathy.  Psychiatric/Behavioral:  Positive for agitation and dysphoric mood. Negative for confusion, sleep disturbance and suicidal ideas. The patient is not nervous/anxious.   BREAST: No symptoms   Objective: There were no vitals taken for  this visit.   Physical Exam Constitutional:      Appearance: She is well-developed.  Genitourinary:     Vulva normal.     Right Labia: No rash, tenderness or lesions.    Left Labia: No tenderness, lesions or rash.    Vaginal discharge present.     No vaginal erythema or tenderness.      Right Adnexa: not tender and no mass present.    Left Adnexa: not tender and no mass present.    No cervical friability or polyp.     Uterus is not enlarged or tender.  Breasts:    Right: No mass, nipple discharge, skin change or tenderness.     Left: No mass, nipple discharge, skin change or tenderness.  Neck:     Thyroid : No thyromegaly.  Cardiovascular:     Rate and Rhythm: Normal rate and regular rhythm.     Heart sounds: Normal heart sounds. No murmur heard. Pulmonary:     Effort: Pulmonary effort is normal.     Breath sounds: Normal breath sounds.  Abdominal:     Palpations: Abdomen is soft.     Tenderness: There is no abdominal tenderness. There is no guarding or rebound.  Musculoskeletal:        General: Normal range of motion.     Cervical back: Normal range of motion.  Lymphadenopathy:     Cervical: No cervical adenopathy.  Neurological:     General: No focal deficit present.     Mental Status: She is alert and oriented to person, place, and time.     Cranial Nerves: No cranial nerve deficit.  Skin:    General: Skin is warm and dry.  Psychiatric:        Mood and Affect: Mood normal.        Behavior: Behavior normal.        Thought Content: Thought content normal.        Judgment: Judgment normal.  Vitals reviewed.    Assessment/Plan: Encounter for annual routine gynecological examination  Cervical cancer screening - Plan: Cytology - PAP,   Screening for STD (sexually transmitted disease) - Plan: Cytology - PAP  Screening for HPV (human papillomavirus) - Plan: Cytology - PAP,   BV (bacterial vaginosis) - Plan: metroNIDAZOLE  (FLAGYL ) 500 MG tablet; Rx RF given hx of  BV. Condoms/withdrawal; try boric acid supp after sex/prn sx for preventive/tx. F/u prn   Need for Tdap vaccination - Plan: Tdap vaccine greater than or equal to 7yo IM  Need for immunization against influenza - Plan: Flu Vaccine QUAD 97mo+IM (Fluarix, Fluzone & Alfiuria Quad PF)    No orders of the defined types were placed in this encounter.    GYN counsel adequate intake of calcium and vitamin D, diet and exercise, tobacco cessation  F/U  No follow-ups on file.  Griffin Dewilde B. Jaivion Kingsley, PA-C 09/24/2023 1:38 PM

## 2023-09-25 ENCOUNTER — Ambulatory Visit: Admitting: Obstetrics and Gynecology

## 2023-09-25 DIAGNOSIS — Z01419 Encounter for gynecological examination (general) (routine) without abnormal findings: Secondary | ICD-10-CM

## 2023-09-25 DIAGNOSIS — B9689 Other specified bacterial agents as the cause of diseases classified elsewhere: Secondary | ICD-10-CM

## 2023-12-20 ENCOUNTER — Ambulatory Visit: Admitting: Internal Medicine

## 2023-12-20 ENCOUNTER — Encounter: Payer: Self-pay | Admitting: Internal Medicine

## 2023-12-20 ENCOUNTER — Ambulatory Visit: Payer: Self-pay | Admitting: Internal Medicine

## 2023-12-20 VITALS — BP 130/86 | HR 67 | Ht 60.0 in | Wt 173.6 lb

## 2023-12-20 DIAGNOSIS — E559 Vitamin D deficiency, unspecified: Secondary | ICD-10-CM | POA: Insufficient documentation

## 2023-12-20 DIAGNOSIS — J0140 Acute pansinusitis, unspecified: Secondary | ICD-10-CM | POA: Insufficient documentation

## 2023-12-20 DIAGNOSIS — Z0000 Encounter for general adult medical examination without abnormal findings: Secondary | ICD-10-CM | POA: Insufficient documentation

## 2023-12-20 DIAGNOSIS — E538 Deficiency of other specified B group vitamins: Secondary | ICD-10-CM | POA: Insufficient documentation

## 2023-12-20 DIAGNOSIS — K219 Gastro-esophageal reflux disease without esophagitis: Secondary | ICD-10-CM

## 2023-12-20 DIAGNOSIS — E66811 Obesity, class 1: Secondary | ICD-10-CM

## 2023-12-20 DIAGNOSIS — L409 Psoriasis, unspecified: Secondary | ICD-10-CM

## 2023-12-20 DIAGNOSIS — Z0001 Encounter for general adult medical examination with abnormal findings: Secondary | ICD-10-CM | POA: Diagnosis not present

## 2023-12-20 DIAGNOSIS — F419 Anxiety disorder, unspecified: Secondary | ICD-10-CM | POA: Diagnosis not present

## 2023-12-20 DIAGNOSIS — F329 Major depressive disorder, single episode, unspecified: Secondary | ICD-10-CM | POA: Diagnosis not present

## 2023-12-20 DIAGNOSIS — E6609 Other obesity due to excess calories: Secondary | ICD-10-CM | POA: Insufficient documentation

## 2023-12-20 DIAGNOSIS — R8290 Unspecified abnormal findings in urine: Secondary | ICD-10-CM

## 2023-12-20 DIAGNOSIS — Z6833 Body mass index (BMI) 33.0-33.9, adult: Secondary | ICD-10-CM

## 2023-12-20 DIAGNOSIS — Z0130 Encounter for examination of blood pressure without abnormal findings: Secondary | ICD-10-CM

## 2023-12-20 LAB — POCT URINALYSIS DIPSTICK
Bilirubin, UA: NEGATIVE
Blood, UA: NEGATIVE
Glucose, UA: NEGATIVE
Ketones, UA: NEGATIVE
Nitrite, UA: NEGATIVE
Protein, UA: NEGATIVE
Spec Grav, UA: 1.02 (ref 1.010–1.025)
Urobilinogen, UA: 0.2 U/dL
pH, UA: 6.5 (ref 5.0–8.0)

## 2023-12-20 MED ORDER — DOXYCYCLINE HYCLATE 100 MG PO TABS: 100.0000 mg | ORAL_TABLET | Freq: Two times a day (BID) | ORAL | 0 refills | Status: AC

## 2023-12-20 MED ORDER — OMEPRAZOLE 40 MG PO CPDR: 40.0000 mg | DELAYED_RELEASE_CAPSULE | Freq: Every day | ORAL | 0 refills | Status: AC

## 2023-12-20 NOTE — Progress Notes (Signed)
 Established Patient Office Visit  Subjective:  Patient ID: Beth Bass, female    DOB: 1985/09/24  Age: 38 y.o. MRN: [id number]  Chief Complaint  Patient presents with   Follow-up    Discuss medications    Patient is here today for follow up. Patient reports doing well and recently moved and having increased stress at work. She has mild elevated BP today and is not currently needing medication. Recommend patient check BP on occasion and report to office if it remains >120/80.  Patient has complaints of sore throat, sinus throbbing, discolored/bloody mucous, sinus headache/pressure for the last week. She denies fever and chills today but reports she felt feverish a few days ago. Patient has been taking Excedrin and tylenol sinus OTC for her symptoms. Will start doxycycline  twice day for 7 days.  She reports her psoriasis is well controlled on her injections.  She reports not being on Buspar anymore and is only taking the Wellbutrin  and Celexa. She is well established with psychiatrist who is controlling her medications.  Patient needs to get Pap smear up dated by her OBGYN as she is due. Patient reports she has quit smoking since her last appointment. She is due for her routine blood work and will collect when fasting and check UA today.   Patient would like a flu shot today.     No other concerns at this time.   Past Medical History:  Diagnosis Date   GERD (gastroesophageal reflux disease)    Molar pregnancy    Psoriasis    Tobacco use     Past Surgical History:  Procedure Laterality Date   DILATION AND CURETTAGE OF UTERUS     Molar Pregnancy   TUBAL LIGATION  2013    Social History   Socioeconomic History   Marital status: Single    Spouse name: Not on file   Number of children: 2   Years of education: Not on file   Highest education level: Not on file  Occupational History   Not on file  Tobacco Use   Smoking status: Former    Types: Cigarettes    Smokeless tobacco: Never  Vaping Use   Vaping status: Never Used  Substance and Sexual Activity   Alcohol use: Yes   Drug use: No   Sexual activity: Yes    Birth control/protection: Surgical    Comment: Tubal ligation  Other Topics Concern   Not on file  Social History Narrative   Not on file   Social Drivers of Health   Financial Resource Strain: Not on file  Food Insecurity: Not on file  Transportation Needs: Not on file  Physical Activity: Not on file  Stress: Not on file  Social Connections: Not on file  Intimate Partner Violence: Not on file    Family History  Problem Relation Age of Onset   Breast cancer Neg Hx    Ovarian cancer Neg Hx     Allergies  Allergen Reactions   Penicillins Hives and Other (See Comments)    Outpatient Medications Prior to Visit  Medication Sig   buPROPion  (WELLBUTRIN  SR) 150 MG 12 hr tablet Take 150 mg by mouth daily.   citalopram (CELEXA) 40 MG tablet Take 40 mg by mouth daily.   ixekizumab (TALTZ) 80 MG/ML pen Inject into the skin.   triamcinolone cream (KENALOG) 0.1 % Apply topically.   [DISCONTINUED] buPROPion  (WELLBUTRIN  XL) 150 MG 24 hr tablet TAKE 1 TABLET BY MOUTH EVERY DAY IN THE MORNING   [  DISCONTINUED] omeprazole  (PRILOSEC) 40 MG capsule TAKE 1 CAPSULE (40 MG TOTAL) BY MOUTH DAILY.   [DISCONTINUED] busPIRone (BUSPAR) 5 MG tablet TAKE 1 TABLET BY MOUTH TWICE DAILY AS NEEDED FOR MOMENTS OF HIGH ANXIETY (Patient not taking: Reported on 12/20/2023)   No facility-administered medications prior to visit.    Review of Systems  Constitutional: Negative.  Negative for chills, fever and malaise/fatigue.  HENT:  Positive for congestion, sinus pain and sore throat.   Eyes: Negative.  Negative for blurred vision and pain.  Respiratory:  Positive for sputum production. Negative for cough and shortness of breath.   Cardiovascular: Negative.  Negative for chest pain, palpitations and leg swelling.  Gastrointestinal:  Positive for  heartburn (needs prilosec refill today she ran out). Negative for abdominal pain, blood in stool, constipation, diarrhea, melena, nausea and vomiting.  Genitourinary: Negative.  Negative for dysuria, flank pain, frequency and urgency.  Musculoskeletal: Negative.  Negative for joint pain and myalgias.  Skin: Negative.   Neurological:  Positive for headaches (sinus headache). Negative for dizziness, tingling, sensory change and weakness.  Endo/Heme/Allergies: Negative.   Psychiatric/Behavioral: Negative.  Negative for depression and suicidal ideas. The patient is not nervous/anxious.        Objective:   BP 130/86   Pulse 67   Ht 5' (1.524 m)   Wt 173 lb 9.6 oz (78.7 kg)   SpO2 98%   BMI 33.90 kg/m   Vitals:   12/20/23 1124  BP: 130/86  Pulse: 67  Height: 5' (1.524 m)  Weight: 173 lb 9.6 oz (78.7 kg)  SpO2: 98%  BMI (Calculated): 33.9    Physical Exam Vitals and nursing note reviewed.  Constitutional:      Appearance: Normal appearance.  HENT:     Head: Normocephalic and atraumatic.     Nose: Congestion present.     Right Sinus: Maxillary sinus tenderness and frontal sinus tenderness present.     Left Sinus: Maxillary sinus tenderness and frontal sinus tenderness present.     Mouth/Throat:     Mouth: Mucous membranes are moist.     Pharynx: Oropharynx is clear.  Eyes:     Conjunctiva/sclera: Conjunctivae normal.     Pupils: Pupils are equal, round, and reactive to light.  Cardiovascular:     Rate and Rhythm: Normal rate and regular rhythm.     Pulses: Normal pulses.     Heart sounds: Normal heart sounds. No murmur heard. Pulmonary:     Effort: Pulmonary effort is normal.     Breath sounds: Normal breath sounds. No wheezing.  Abdominal:     General: Bowel sounds are normal.     Palpations: Abdomen is soft.     Tenderness: There is no abdominal tenderness. There is no right CVA tenderness or left CVA tenderness.  Musculoskeletal:        General: Normal range of  motion.     Cervical back: Normal range of motion.     Right lower leg: No edema.     Left lower leg: No edema.  Skin:    General: Skin is warm and dry.  Neurological:     General: No focal deficit present.     Mental Status: She is alert and oriented to person, place, and time.  Psychiatric:        Mood and Affect: Mood normal.        Behavior: Behavior normal.      Results for orders placed or performed in visit on 12/20/23  POCT Urinalysis Dipstick (81002)  Result Value Ref Range   Color, UA Yellow    Clarity, UA Clear    Glucose, UA Negative Negative   Bilirubin, UA Negative    Ketones, UA Negative    Spec Grav, UA 1.020 1.010 - 1.025   Blood, UA Negative    pH, UA 6.5 5.0 - 8.0   Protein, UA Negative Negative   Urobilinogen, UA 0.2 0.2 or 1.0 E.U./dL   Nitrite, UA Negative    Leukocytes, UA Small (1+) (A) Negative   Appearance Clear    Odor Yes     Recent Results (from the past 2160 hours)  POCT Urinalysis Dipstick (18997)     Status: Abnormal   Collection Time: 12/20/23 11:56 AM  Result Value Ref Range   Color, UA Yellow    Clarity, UA Clear    Glucose, UA Negative Negative   Bilirubin, UA Negative    Ketones, UA Negative    Spec Grav, UA 1.020 1.010 - 1.025   Blood, UA Negative    pH, UA 6.5 5.0 - 8.0   Protein, UA Negative Negative   Urobilinogen, UA 0.2 0.2 or 1.0 E.U./dL   Nitrite, UA Negative    Leukocytes, UA Small (1+) (A) Negative   Appearance Clear    Odor Yes       Assessment & Plan:  Start doxycycline  twice day for 7 days. Refills sent. Continue medications as prescribed. Check routine labs when patient is fasting. Check UA today. Recommend patient get PAP smear at her OBGYN. Recommend patient keep specialists appointments as scheduled. Reinforced healthy diet and exercise as tolerated to improve BP and promote weight loss. BP goal <120/80. Problem List Items Addressed This Visit     Psoriasis   GERD (gastroesophageal reflux disease)    Relevant Medications   omeprazole  (PRILOSEC) 40 MG capsule   Anxiety   Relevant Medications   citalopram (CELEXA) 40 MG tablet   buPROPion  (WELLBUTRIN  SR) 150 MG 12 hr tablet   Depression   Relevant Medications   citalopram (CELEXA) 40 MG tablet   buPROPion  (WELLBUTRIN  SR) 150 MG 12 hr tablet   Encounter for preventative adult health care examination - Primary   Relevant Orders   POCT Urinalysis Dipstick (81002) (Completed)   Lipid panel   CMP14+EGFR   TSH   Vitamin D deficiency   Relevant Orders   VITAMIN D 25 Hydroxy (Vit-D Deficiency, Fractures)   Vitamin B12 deficiency   Relevant Orders   Vitamin B12   Acute pansinusitis   Relevant Medications   doxycycline  (VIBRA -TABS) 100 MG tablet   Class 1 obesity due to excess calories without serious comorbidity with body mass index (BMI) of 33.0 to 33.9 in adult    Return in about 4 months (around 04/18/2024).   Total time spent: 30 minutes. This time includes review of previous notes and results and patient face to face interaction during today's visit.    FERNAND FREDY RAMAN, MD  12/20/2023   This document may have been prepared by [facility] Voice Recognition software and as such may include unintentional dictation errors.

## 2023-12-20 NOTE — Progress Notes (Signed)
 Patient notified

## 2023-12-22 LAB — URINE CULTURE

## 2023-12-24 NOTE — Progress Notes (Signed)
Pt informed

## 2023-12-24 NOTE — Progress Notes (Signed)
 Left vm to return call.

## 2024-02-14 ENCOUNTER — Other Ambulatory Visit

## 2024-02-14 DIAGNOSIS — E538 Deficiency of other specified B group vitamins: Secondary | ICD-10-CM

## 2024-02-14 DIAGNOSIS — Z0000 Encounter for general adult medical examination without abnormal findings: Secondary | ICD-10-CM

## 2024-02-14 DIAGNOSIS — E559 Vitamin D deficiency, unspecified: Secondary | ICD-10-CM

## 2024-02-15 LAB — CMP14+EGFR
ALT: 21 IU/L (ref 0–32)
AST: 15 IU/L (ref 0–40)
Albumin: 4.2 g/dL (ref 3.9–4.9)
Alkaline Phosphatase: 70 IU/L (ref 41–116)
BUN/Creatinine Ratio: 25 — ABNORMAL HIGH (ref 9–23)
BUN: 16 mg/dL (ref 6–20)
Bilirubin Total: 0.3 mg/dL (ref 0.0–1.2)
CO2: 22 mmol/L (ref 20–29)
Calcium: 9.1 mg/dL (ref 8.7–10.2)
Chloride: 103 mmol/L (ref 96–106)
Creatinine, Ser: 0.63 mg/dL (ref 0.57–1.00)
Globulin, Total: 3 g/dL (ref 1.5–4.5)
Glucose: 92 mg/dL (ref 70–99)
Potassium: 4.5 mmol/L (ref 3.5–5.2)
Sodium: 137 mmol/L (ref 134–144)
Total Protein: 7.2 g/dL (ref 6.0–8.5)
eGFR: 116 mL/min/1.73

## 2024-02-15 LAB — LIPID PANEL
Chol/HDL Ratio: 4.4 ratio (ref 0.0–4.4)
Cholesterol, Total: 208 mg/dL — ABNORMAL HIGH (ref 100–199)
HDL: 47 mg/dL
LDL Chol Calc (NIH): 131 mg/dL — ABNORMAL HIGH (ref 0–99)
Triglycerides: 170 mg/dL — ABNORMAL HIGH (ref 0–149)
VLDL Cholesterol Cal: 30 mg/dL (ref 5–40)

## 2024-02-15 LAB — VITAMIN B12: Vitamin B-12: 531 pg/mL (ref 232–1245)

## 2024-02-15 LAB — VITAMIN D 25 HYDROXY (VIT D DEFICIENCY, FRACTURES): Vit D, 25-Hydroxy: 20.9 ng/mL — ABNORMAL LOW (ref 30.0–100.0)

## 2024-02-15 LAB — TSH: TSH: 0.962 u[IU]/mL (ref 0.450–4.500)

## 2024-02-17 MED ORDER — VITAMIN D3 1.25 MG (50000 UT) PO CAPS: 1.0000 | ORAL_CAPSULE | ORAL | 3 refills | Status: AC

## 2024-02-17 NOTE — Addendum Note (Signed)
 Addended by: FERNAND FREDY RAMAN on: 02/17/2024 10:03 AM   Modules accepted: Orders

## 2024-04-20 ENCOUNTER — Ambulatory Visit: Admitting: Internal Medicine
